# Patient Record
Sex: Male | Born: 1935 | Race: White | Hispanic: No | Marital: Single | Smoking: Current every day smoker
Health system: Southern US, Community
[De-identification: ages and names within clinical notes are randomized; demographics above are authoritative.]

## PROBLEM LIST (undated history)

## (undated) DIAGNOSIS — Z720 Tobacco use: Secondary | ICD-10-CM

## (undated) DIAGNOSIS — I10 Essential (primary) hypertension: Secondary | ICD-10-CM

## (undated) DIAGNOSIS — E785 Hyperlipidemia, unspecified: Secondary | ICD-10-CM

## (undated) DIAGNOSIS — N529 Male erectile dysfunction, unspecified: Secondary | ICD-10-CM

## (undated) DIAGNOSIS — I442 Atrioventricular block, complete: Secondary | ICD-10-CM

## (undated) DIAGNOSIS — J45909 Unspecified asthma, uncomplicated: Secondary | ICD-10-CM

## (undated) DIAGNOSIS — I2510 Atherosclerotic heart disease of native coronary artery without angina pectoris: Secondary | ICD-10-CM

## (undated) DIAGNOSIS — I739 Peripheral vascular disease, unspecified: Secondary | ICD-10-CM

## (undated) HISTORY — DX: Tobacco use: Z72.0

## (undated) HISTORY — DX: Essential (primary) hypertension: I10

## (undated) HISTORY — DX: Male erectile dysfunction, unspecified: N52.9

## (undated) HISTORY — DX: Peripheral vascular disease, unspecified: I73.9

## (undated) HISTORY — DX: Atrioventricular block, complete: I44.2

## (undated) HISTORY — DX: Atherosclerotic heart disease of native coronary artery without angina pectoris: I25.10

## (undated) HISTORY — DX: Hyperlipidemia, unspecified: E78.5

## (undated) HISTORY — PX: OTHER SURGICAL HISTORY: SHX169

## (undated) HISTORY — DX: Unspecified asthma, uncomplicated: J45.909

## (undated) HISTORY — PX: TONSILLECTOMY: SUR1361

---

## 2001-10-18 ENCOUNTER — Inpatient Hospital Stay (HOSPITAL_COMMUNITY): Admission: RE | Admit: 2001-10-18 | Discharge: 2001-10-18 | Payer: Self-pay | Admitting: Neurosurgery

## 2001-10-18 ENCOUNTER — Encounter: Payer: Self-pay | Admitting: Neurosurgery

## 2002-02-08 ENCOUNTER — Encounter: Payer: Self-pay | Admitting: Neurosurgery

## 2002-02-08 ENCOUNTER — Ambulatory Visit (HOSPITAL_COMMUNITY): Admission: RE | Admit: 2002-02-08 | Discharge: 2002-02-08 | Payer: Self-pay | Admitting: Neurosurgery

## 2003-02-04 ENCOUNTER — Ambulatory Visit (HOSPITAL_COMMUNITY): Admission: RE | Admit: 2003-02-04 | Discharge: 2003-02-04 | Payer: Self-pay | Admitting: Neurosurgery

## 2003-03-06 ENCOUNTER — Encounter: Admission: RE | Admit: 2003-03-06 | Discharge: 2003-03-06 | Payer: Self-pay | Admitting: Family Medicine

## 2003-04-19 ENCOUNTER — Emergency Department (HOSPITAL_COMMUNITY): Admission: EM | Admit: 2003-04-19 | Discharge: 2003-04-19 | Payer: Self-pay | Admitting: *Deleted

## 2004-06-17 ENCOUNTER — Ambulatory Visit: Payer: Self-pay | Admitting: Family Medicine

## 2005-01-05 ENCOUNTER — Ambulatory Visit: Payer: Self-pay | Admitting: Family Medicine

## 2005-05-22 ENCOUNTER — Encounter: Admission: RE | Admit: 2005-05-22 | Discharge: 2005-05-22 | Payer: Self-pay | Admitting: Ophthalmology

## 2005-05-26 ENCOUNTER — Ambulatory Visit: Payer: Self-pay | Admitting: Internal Medicine

## 2005-05-31 ENCOUNTER — Encounter: Admission: RE | Admit: 2005-05-31 | Discharge: 2005-05-31 | Payer: Self-pay | Admitting: Neurology

## 2005-09-13 ENCOUNTER — Ambulatory Visit: Payer: Self-pay | Admitting: Family Medicine

## 2006-06-17 ENCOUNTER — Emergency Department (HOSPITAL_COMMUNITY): Admission: EM | Admit: 2006-06-17 | Discharge: 2006-06-17 | Payer: Self-pay | Admitting: Emergency Medicine

## 2006-09-14 ENCOUNTER — Ambulatory Visit: Payer: Self-pay | Admitting: Family Medicine

## 2006-10-12 DIAGNOSIS — I10 Essential (primary) hypertension: Secondary | ICD-10-CM | POA: Insufficient documentation

## 2006-10-12 DIAGNOSIS — Z8679 Personal history of other diseases of the circulatory system: Secondary | ICD-10-CM | POA: Insufficient documentation

## 2006-11-08 ENCOUNTER — Ambulatory Visit: Payer: Self-pay | Admitting: Family Medicine

## 2006-11-08 LAB — CONVERTED CEMR LAB
BUN: 11 mg/dL (ref 6–23)
Basophils Relative: 0.9 % (ref 0.0–1.0)
Bilirubin Urine: NEGATIVE
Bilirubin, Direct: 0.2 mg/dL (ref 0.0–0.3)
Creatinine, Ser: 1.3 mg/dL (ref 0.4–1.5)
Direct LDL: 171.8 mg/dL
Eosinophils Absolute: 0.2 10*3/uL (ref 0.0–0.6)
GFR calc non Af Amer: 57 mL/min
MCHC: 34.3 g/dL (ref 30.0–36.0)
Monocytes Absolute: 0.7 10*3/uL (ref 0.2–0.7)
Monocytes Relative: 7.8 % (ref 3.0–11.0)
Neutro Abs: 4.9 10*3/uL (ref 1.4–7.7)
Neutrophils Relative %: 52.5 % (ref 43.0–77.0)
Nitrite: NEGATIVE
PSA: 1.24 ng/mL (ref 0.10–4.00)
Platelets: 271 10*3/uL (ref 150–400)
Protein, U semiquant: NEGATIVE
RBC: 4.89 M/uL (ref 4.22–5.81)
RDW: 13.1 % (ref 11.5–14.6)
Specific Gravity, Urine: 1.015
TSH: 4.21 microintl units/mL (ref 0.35–5.50)
Total CHOL/HDL Ratio: 7.5
Total Protein: 6.6 g/dL (ref 6.0–8.3)
Triglycerides: 114 mg/dL (ref 0–149)
VLDL: 23 mg/dL (ref 0–40)
WBC: 9.3 10*3/uL (ref 4.5–10.5)
pH: 5

## 2006-11-15 ENCOUNTER — Ambulatory Visit: Payer: Self-pay | Admitting: Family Medicine

## 2006-11-15 DIAGNOSIS — I2510 Atherosclerotic heart disease of native coronary artery without angina pectoris: Secondary | ICD-10-CM | POA: Insufficient documentation

## 2006-11-15 DIAGNOSIS — F17200 Nicotine dependence, unspecified, uncomplicated: Secondary | ICD-10-CM

## 2006-11-29 ENCOUNTER — Ambulatory Visit: Payer: Self-pay | Admitting: Family Medicine

## 2007-08-06 ENCOUNTER — Ambulatory Visit: Payer: Self-pay | Admitting: Family Medicine

## 2007-08-06 DIAGNOSIS — H6120 Impacted cerumen, unspecified ear: Secondary | ICD-10-CM

## 2007-11-11 ENCOUNTER — Ambulatory Visit: Payer: Self-pay | Admitting: Family Medicine

## 2007-11-11 DIAGNOSIS — I498 Other specified cardiac arrhythmias: Secondary | ICD-10-CM

## 2007-11-13 ENCOUNTER — Telehealth: Payer: Self-pay | Admitting: Family Medicine

## 2007-11-14 ENCOUNTER — Ambulatory Visit: Payer: Self-pay | Admitting: Family Medicine

## 2007-11-18 ENCOUNTER — Telehealth: Payer: Self-pay | Admitting: Cardiology

## 2007-11-19 ENCOUNTER — Ambulatory Visit: Payer: Self-pay | Admitting: Cardiology

## 2007-11-21 ENCOUNTER — Encounter: Payer: Self-pay | Admitting: Cardiology

## 2007-11-21 ENCOUNTER — Ambulatory Visit: Payer: Self-pay

## 2007-11-21 ENCOUNTER — Telehealth: Payer: Self-pay | Admitting: Family Medicine

## 2007-11-25 ENCOUNTER — Ambulatory Visit: Payer: Self-pay | Admitting: Cardiology

## 2007-11-28 ENCOUNTER — Ambulatory Visit: Payer: Self-pay | Admitting: Cardiology

## 2007-11-28 LAB — CONVERTED CEMR LAB
Basophils Relative: 0.9 % (ref 0.0–3.0)
Eosinophils Relative: 1.8 % (ref 0.0–5.0)
Glucose, Bld: 99 mg/dL (ref 70–99)
HCT: 49 % (ref 39.0–52.0)
Hemoglobin: 16.9 g/dL (ref 13.0–17.0)
INR: 1 (ref 0.8–1.0)
Lymphocytes Relative: 32.5 % (ref 12.0–46.0)
MCHC: 34.5 g/dL (ref 30.0–36.0)
Neutro Abs: 5.4 10*3/uL (ref 1.4–7.7)
Platelets: 233 10*3/uL (ref 150–400)
Potassium: 4.7 meq/L (ref 3.5–5.1)
Prothrombin Time: 12.2 s (ref 10.9–13.3)
RBC: 5.21 M/uL (ref 4.22–5.81)
Sodium: 142 meq/L (ref 135–145)
WBC: 9.8 10*3/uL (ref 4.5–10.5)
aPTT: 32.1 s — ABNORMAL HIGH (ref 21.7–29.8)

## 2007-11-29 ENCOUNTER — Inpatient Hospital Stay (HOSPITAL_BASED_OUTPATIENT_CLINIC_OR_DEPARTMENT_OTHER): Admission: RE | Admit: 2007-11-29 | Discharge: 2007-11-29 | Payer: Self-pay | Admitting: Internal Medicine

## 2007-11-29 ENCOUNTER — Ambulatory Visit: Payer: Self-pay | Admitting: Internal Medicine

## 2007-12-04 ENCOUNTER — Ambulatory Visit: Payer: Self-pay | Admitting: Internal Medicine

## 2007-12-05 DIAGNOSIS — I442 Atrioventricular block, complete: Secondary | ICD-10-CM

## 2007-12-05 HISTORY — PX: PACEMAKER INSERTION: SHX728

## 2007-12-05 HISTORY — DX: Atrioventricular block, complete: I44.2

## 2007-12-06 ENCOUNTER — Ambulatory Visit (HOSPITAL_COMMUNITY): Admission: RE | Admit: 2007-12-06 | Discharge: 2007-12-07 | Payer: Self-pay | Admitting: Internal Medicine

## 2007-12-06 ENCOUNTER — Ambulatory Visit: Payer: Self-pay | Admitting: Internal Medicine

## 2007-12-16 ENCOUNTER — Ambulatory Visit: Payer: Self-pay | Admitting: Internal Medicine

## 2008-03-05 ENCOUNTER — Ambulatory Visit: Payer: Self-pay | Admitting: Internal Medicine

## 2008-04-13 DIAGNOSIS — I442 Atrioventricular block, complete: Secondary | ICD-10-CM

## 2008-10-30 ENCOUNTER — Encounter (INDEPENDENT_AMBULATORY_CARE_PROVIDER_SITE_OTHER): Payer: Self-pay | Admitting: *Deleted

## 2008-11-27 ENCOUNTER — Ambulatory Visit: Payer: Self-pay | Admitting: Internal Medicine

## 2008-11-27 ENCOUNTER — Encounter (INDEPENDENT_AMBULATORY_CARE_PROVIDER_SITE_OTHER): Payer: Self-pay | Admitting: *Deleted

## 2008-11-27 ENCOUNTER — Ambulatory Visit: Payer: Self-pay

## 2008-11-27 DIAGNOSIS — I359 Nonrheumatic aortic valve disorder, unspecified: Secondary | ICD-10-CM

## 2008-11-27 DIAGNOSIS — I714 Abdominal aortic aneurysm, without rupture, unspecified: Secondary | ICD-10-CM | POA: Insufficient documentation

## 2008-12-14 ENCOUNTER — Encounter: Payer: Self-pay | Admitting: Internal Medicine

## 2009-01-14 ENCOUNTER — Telehealth (INDEPENDENT_AMBULATORY_CARE_PROVIDER_SITE_OTHER): Payer: Self-pay | Admitting: *Deleted

## 2009-07-20 ENCOUNTER — Encounter: Payer: Self-pay | Admitting: Internal Medicine

## 2009-07-21 ENCOUNTER — Ambulatory Visit (HOSPITAL_COMMUNITY): Admission: RE | Admit: 2009-07-21 | Discharge: 2009-07-21 | Payer: Self-pay | Admitting: Internal Medicine

## 2009-07-21 ENCOUNTER — Encounter: Payer: Self-pay | Admitting: Internal Medicine

## 2009-07-21 ENCOUNTER — Ambulatory Visit: Payer: Self-pay

## 2009-07-21 ENCOUNTER — Ambulatory Visit: Payer: Self-pay | Admitting: Internal Medicine

## 2009-08-16 ENCOUNTER — Ambulatory Visit: Payer: Self-pay

## 2009-08-16 ENCOUNTER — Ambulatory Visit: Payer: Self-pay | Admitting: Internal Medicine

## 2009-08-17 ENCOUNTER — Encounter: Payer: Self-pay | Admitting: Internal Medicine

## 2009-08-17 ENCOUNTER — Telehealth: Payer: Self-pay | Admitting: Internal Medicine

## 2009-08-24 ENCOUNTER — Telehealth: Payer: Self-pay | Admitting: Internal Medicine

## 2009-09-01 ENCOUNTER — Ambulatory Visit: Payer: Self-pay | Admitting: Internal Medicine

## 2009-09-01 DIAGNOSIS — I739 Peripheral vascular disease, unspecified: Secondary | ICD-10-CM

## 2009-09-02 ENCOUNTER — Telehealth (INDEPENDENT_AMBULATORY_CARE_PROVIDER_SITE_OTHER): Payer: Self-pay | Admitting: *Deleted

## 2009-09-09 ENCOUNTER — Encounter: Payer: Self-pay | Admitting: Internal Medicine

## 2009-10-01 ENCOUNTER — Ambulatory Visit: Payer: Self-pay | Admitting: Cardiovascular Disease

## 2010-03-27 LAB — CONVERTED CEMR LAB
ALT: 16 units/L (ref 0–53)
AST: 21 units/L (ref 0–37)
Albumin: 4 g/dL (ref 3.5–5.2)
Alkaline Phosphatase: 55 units/L (ref 39–117)
BUN: 13 mg/dL (ref 6–23)
Cholesterol: 142 mg/dL (ref 0–200)
Cholesterol: 204 mg/dL — ABNORMAL HIGH (ref 0–200)
LDL Cholesterol: 88 mg/dL (ref 0–99)
Sodium: 142 meq/L (ref 135–145)
Total Bilirubin: 0.7 mg/dL (ref 0.3–1.2)
Total CHOL/HDL Ratio: 5

## 2010-03-29 ENCOUNTER — Encounter (INDEPENDENT_AMBULATORY_CARE_PROVIDER_SITE_OTHER): Payer: Self-pay | Admitting: *Deleted

## 2010-03-29 NOTE — Cardiovascular Report (Signed)
Summary: Office Visit   Office Visit   Imported By: Roderic Ovens 09/02/2009 11:09:44  _____________________________________________________________________  External Attachment:    Type:   Image     Comment:   External Document

## 2010-03-29 NOTE — Assessment & Plan Note (Signed)
Summary: rov/ gd   Visit Type:  Follow-up Primary Provider:  Dr Marjory Lies   History of Present Illness: The patient presents today for routine electrophysiology followup. He reports doing very well since last being seen in our clinic. The patient denies symptoms of palpitations, chest pain, shortness of breath, orthopnea, PND, lower extremity edema, dizziness, presyncope, syncope, or neurologic sequela. The patient is tolerating medications without difficulties and is otherwise without complaint today.   Current Medications (verified): 1)  Adult Aspirin Low Strength 81 Mg  Tbdp (Aspirin) .... Take One Tablet By Mouth Once Daily. 2)  Norvasc 10 Mg Tabs (Amlodipine Besylate) .... Take One Tablet Daily  Allergies: 1)  ! Penicillin 2)  ! * Mycins  Past History:  Past Medical History: Hypertension Transient ischemic attack, hx of ED Coronary artery disease tobacco abuse Peripheral vascular disease  Hyperlipidemia.  Second degree AV block s/p PPM 12/05/07, now complete heart  block moderate AS  Past Surgical History: Reviewed history from 04/13/2008 and no changes required. Lumbar HNP Tonsillectomy Pacemaker implantation 12/05/07  Social History: Reviewed history from 04/13/2008 and no changes required. The patient is divorced and lives in [place].  He has smoked one- pack per day for more than 60 years.  He drinks two scotch drinks per day.  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   Review of Systems       All systems are reviewed and negative except as listed in the HPI.   Vital Signs:  Patient profile:   77 year old male Height:      66 inches Weight:      167 pounds BMI:     27.05 Pulse rate:   72 / minute BP sitting:   118 / 70  (left arm)  Vitals Entered By: Laurance Flatten CMA (Jul 21, 2009 10:37 AM)  Physical Exam  General:  Well developed, well nourished, in no acute distress. Head:  normocephalic and atraumatic Eyes:  PERRLA/EOM intact;  conjunctiva and lids normal. Mouth:  Teeth, gums and palate normal. Oral mucosa normal. Neck:  No deformities, masses, or tenderness noted. Lungs:  Clear bilaterally to auscultation and percussion. Heart:  RRR, 3/6 SEM LUSB (mid peaking) Abdomen:  Bowel sounds positive; abdomen soft and non-tender without masses, organomegaly, or hernias noted. No hepatosplenomegaly. Msk:  Back normal, normal gait. Muscle strength and tone normal. Pulses:  pulses normal in all 4 extremities Extremities:  No clubbing or cyanosis. Neurologic:  Alert and oriented x 3. Skin:  Intact without lesions or rashes. Cervical Nodes:  no significant adenopathy Psych:  Normal affect.   PPM Specifications Following MD:  Hillis Range, MD     PPM Vendor:  [employer] Jude     PPM Model Number:  [id number]     PPM Serial Number:  [id number] PPM DOI:  12/06/2007     PPM Implanting MD:  Lewayne Bunting, MD  Lead 1    Location: RA     DOI: 12/06/2007     Model #: [id number]     Serial #: [id number]     Status: active Lead 2    Location: RV     DOI: 12/06/2007     Model #: 1788TC     Serial #: [id number]     Status: active   Indications:  CHB   PPM Follow Up Battery Voltage:  2.78 V     Battery Est. Longevity:  >33yrs     Pacer Dependent:  Yes  PPM Device Measurements Atrium  Amplitude: 1.8 mV, Impedance: 401 ohms, Threshold: 0.50 V at 0.4 msec Right Ventricle  Amplitude: paced mV, Impedance: 472 ohms, Threshold: 0.75 V at 0.40 msec  Episodes MS Episodes:  166     Percent Mode Switch:  <1%     Coumadin:  No Ventricular High Rate:  0     Atrial Pacing:  5.0%     Ventricular Pacing:  99%  Parameters Mode:  DDD     Lower Rate Limit:  60     Upper Rate Limit:  120 Paced AV Delay:  180     Sensed AV Delay:  150 Tech Comments:  NORMAL DEVICE FUNCTION.  RV AMPLITUDE CHANGED FROM 0.875 TO 1.00 V.  TURNED AUTO MODE SWITCH FROM DDI TO DDIR.  LONGEST MODE SWITCH WAS 6 MINUTES 48 SECONDS.  SOME HIGH VENTRICULAR THRESHOLDS ON TREND BUT AUTOCAPTURE  RECOMMENDED TODAY W/NORMAL THRESHOLD. WILL DISCUSS W/DR Johney Frame NEED TO TURN ON EGMs TO EVALUATE MODE SWITCH EPISODES.  kristin bankston MD Comments:  agree  Impression & Recommendations:  Problem # 1:  SECOND DEGREE AV BLOCK, MOBITZ II (ICD-426.12) Now complete HB normal pacemaker function  Problem # 2:  AORTIC STENOSIS (ICD-424.1) no symptoms moderate by exam echo this am pending  Problem # 3:  TOBACCO ABUSE (ICD-305.1) cessation advised, pt not willing to quit  Problem # 4:  HYPERTENSION (ICD-401.9)  stable The following medications were removed from the medication list:    Amlodipine Besylate 5 Mg Tabs (Amlodipine besylate) .Marland Kitchen... Take one tablet once daily His updated medication list for this problem includes:    Adult Aspirin Low Strength 81 Mg Tbdp (Aspirin) .Marland Kitchen... Take one tablet by mouth once daily.    Norvasc 10 Mg Tabs (Amlodipine besylate) .Marland Kitchen... Take one tablet daily  The following medications were removed from the medication list:    Amlodipine Besylate 5 Mg Tabs (Amlodipine besylate) .Marland Kitchen... Take one tablet once daily His updated medication list for this problem includes:    Adult Aspirin Low Strength 81 Mg Tbdp (Aspirin) .Marland Kitchen... Take one tablet by mouth once daily.    Norvasc 10 Mg Tabs (Amlodipine besylate) .Marland Kitchen... Take one tablet daily  Patient Instructions: 1)  Your physician recommends that you schedule a follow-up appointment in: 6 months with device clinic 2)  Your physician recommends that you return for lab work in: 08/16/09 at the same time as your doppler appoinment.  Don't eat or drink after midnight the night before you come  Appended Document: rov/ gd Echo reveals EF 40-45% with moderate AS. Repeat Echo in 6 months.

## 2010-03-29 NOTE — Assessment & Plan Note (Signed)
Summary: rov/f/u from echo & Aorta  Medications Added SIMVASTATIN 20 MG TABS (SIMVASTATIN) Take one tablet by mouth daily at bedtime * CINNAMON once daily * BROMELAIN once daily GINKGO 60 MG TABS (GINKGO BILOBA) once daily RED YEAST RICE   POWD (RED YEAST RICE EXTRACT) once daily        Visit Type:  Follow-up Primary Provider:  Dr Marjory Lies   History of Present Illness: The patient presents today for routine electrophysiology followup. He reports doing very well since last being seen in our clinic.  He reports weakness in his legs and leg fatigue with moderate activity.  He reports occasional "tingling" in his toes.  He denies rest pain.  The patient denies symptoms of palpitations, chest pain, shortness of breath, orthopnea, PND, lower extremity edema, dizziness, presyncope, syncope, or neurologic sequela. The patient is tolerating medications without difficulties and is otherwise without complaint today.   Current Medications (verified): 1)  Adult Aspirin Low Strength 81 Mg  Tbdp (Aspirin) .... Take One Tablet By Mouth Once Daily. 2)  Norvasc 10 Mg Tabs (Amlodipine Besylate) .... Take One Tablet Daily 3)  Simvastatin 20 Mg Tabs (Simvastatin) .... Take One Tablet By Mouth Daily At Bedtime 4)  Cinnamon .... Once Daily 5)  Bromelain .... Once Daily 6)  Ginkgo 60 Mg Tabs (Ginkgo Biloba) .... Once Daily 7)  Red Yeast Rice   Powd (Red Yeast Rice Extract) .... Once Daily  Allergies: 1)  ! Penicillin 2)  ! * Mycins  Past History:  Past Medical History: Hypertension Transient ischemic attack, hx of ED Coronary artery disease tobacco abuse Peripheral vascular disease - infrarenal AAA (2.9x3.3cm),  severe PAD involving both iliac vessels Hyperlipidemia.  Second degree AV block s/p PPM 12/05/07, now complete heart  block moderate AS  Past Surgical History: Reviewed history from 04/13/2008 and no changes required. Lumbar HNP Tonsillectomy Pacemaker implantation  12/05/07  Social History: Reviewed history from 04/13/2008 and no changes required. The patient is divorced and lives in [place].  He has smoked one- pack per day for more than 60 years.  He drinks two scotch drinks per day.  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   Review of Systems       All systems are reviewed and negative except as listed in the HPI.   Vital Signs:  Patient profile:   77 year old male Height:      66 inches Weight:      166 pounds BMI:     26.89 Pulse rate:   72 / minute BP sitting:   128 / 70  (left arm)  Vitals Entered By: Laurance Flatten CMA (September 01, 2009 9:07 AM)  Physical Exam  General:  Well developed, well nourished, in no acute distress. Head:  normocephalic and atraumatic Eyes:  PERRLA/EOM intact; conjunctiva and lids normal. Mouth:  Teeth, gums and palate normal. Oral mucosa normal. Neck:  No deformities, masses, or tenderness noted. Chest Wall:  pacemaker site is well healed Lungs:  Clear bilaterally to auscultation and percussion. Heart:  RRR, 3/6 SEM LUSB (mid peaking) Abdomen:  Bowel sounds positive; abdomen soft and non-tender without masses, organomegaly, or hernias noted. No hepatosplenomegaly. Msk:  Back normal, normal gait. Muscle strength and tone normal. Pulses:  diminished pulses DP/PT bilateral,  good cap refill Extremities:  No clubbing or cyanosis. Neurologic:  Alert and oriented x 3. Skin:  Intact without lesions or rashes. Psych:  Normal affect.   PPM Specifications Following MD:  Hillis Range, MD     PPM Vendor:  [employer] Jude     PPM Model Number:  [id number]     PPM Serial Number:  [id number] PPM DOI:  12/06/2007     PPM Implanting MD:  Lewayne Bunting, MD  Lead 1    Location: RA     DOI: 12/06/2007     Model #: 1699TC     Serial #: [id number]     Status: active Lead 2    Location: RV     DOI: 12/06/2007     Model #: 1788TC     Serial #: [id number]     Status: active  Magnet Response Rate:  BOL 98.6 ERI 86.3  Indications:   CHB   PPM Follow Up Remote Check?  No Battery Voltage:  2.78 V     Battery Est. Longevity:  10 YEARS     Pacer Dependent:  Yes       PPM Device Measurements Atrium  Amplitude: 2.0 mV, Impedance: 379 ohms, Threshold: 0.5 V at 0.4 msec Right Ventricle  Impedance: 453 ohms, Threshold: 0.75 V at 0.4 msec  Episodes MS Episodes:  18     Percent Mode Switch:  <1%     Coumadin:  No Atrial Pacing:  6.6%     Ventricular Pacing:  100%  Parameters Mode:  DDD     Lower Rate Limit:  60     Upper Rate Limit:  120 Paced AV Delay:  180     Sensed AV Delay:  150 Next Cardiology Appt Due:  02/27/2010 Tech Comments:  No parameter changes.  Device function normal.  18 mode switch episodes the longest 5:44 minutes, - coumadin, ventricular rates controlled.   ROV 6 months clinic. Altha Harm, LPN  September 02, 1599 9:24 AM  MD Comments:  agree  Impression & Recommendations:  Problem # 1:  SECOND DEGREE AV BLOCK, MOBITZ II (ICD-426.12) now complete AV block normal pacemaker function  Problem # 2:  TOBACCO ABUSE (ICD-305.1) cessation advised,  pt very clear in his decision to continue smoking  Problem # 3:  PVD (ICD-443.9) recent US results reviewed with patient stable exam tobacco cessation and daily walking encouraged will schedule pt to see Dr Excell Seltzer for evaluation and possible PV study  Other Orders: Misc. Referral (Misc. Ref)  Patient Instructions: 1)  Your physician recommends that you schedule a follow-up appointment in: 6 months with Dr. Oneda Duffett./ Follow up with Dr. Excell Seltzer , next available appointment. 2)  Your physician recommends that you continue on your current medications as directed. Please refer to the Current Medication list given to you today. 3)  Your physician recommends that you Stop smoking. 4)  Your physician recommends that you  walk per day.

## 2010-03-29 NOTE — Miscellaneous (Signed)
Summary: [employer] Designated Party Release   Imported By: Roderic Ovens 09/17/2009 11:57:19  _____________________________________________________________________  External Attachment:    Type:   Image     Comment:   External Document

## 2010-03-29 NOTE — Progress Notes (Signed)
Summary: returning call   Phone Note Call from Patient Call back at Work Phone [phone]   Caller: Daughter Reason for Call: Talk to Nurse Summary of Call: returning call Initial call taken by: Migdalia Dk,  August 17, 2009 9:37 AM  Follow-up for Phone Call        called daughter back and lmom with the results of labs.  Also let her know we had mailed a copy to her father.   Dennis Bast, RN, BSN  August 17, 2009 10:07 AM

## 2010-03-29 NOTE — Letter (Signed)
Summary: Custom - Lipid  [facility], Main Office  [address]   [place], [place] [address]   Phone: [phone]  Fax: [phone]     August 17, 2009 MRN: [id number]   John Farmer [address] [place], [place]  [address]   Dear Mr. DECARLO,  We have reviewed your cholesterol results.  They are as follows:     Total Cholesterol:    204 (Desirable: less than 200)       HDL  Cholesterol:     39.00  (Desirable: greater than 40 for men and 50 for women)       LDL Cholesterol:       88  (Desirable: less than 100 for low risk and less than 70 for moderate to high risk)       Triglycerides:       84.0  (Desirable: less than 150)  Our recommendations include:   Call our office at the number listed above if you have any questions.  Lowering your LDL cholesterol is important, but it is only one of a large number of "risk factors" that may indicate that you are at risk for heart disease, stroke or other complications of hardening of the arteries.  Other risk factors include:   A.  Cigarette Smoking* B.  High Blood Pressure* C.  Obesity* D.   Low HDL Cholesterol (see yours above)* E.   Diabetes Mellitus (higher risk if your is uncontrolled) F.  Family history of premature heart disease G.  Previous history of stroke or cardiovascular disease    *These are risk factors YOU HAVE CONTROL OVER.  For more information, visit .  There is now evidence that lowering the TOTAL CHOLESTEROL AND LDL CHOLESTEROL can reduce the risk of heart disease.  The American Heart Association recommends the following guidelines for the treatment of elevated cholesterol:  1.  If there is now current heart disease and less than two risk factors, TOTAL CHOLESTEROL should be less than 200 and LDL CHOLESTEROL should be less than 100. 2.  If there is current heart disease or two or more risk factors, TOTAL CHOLESTEROL should be less than 200 and LDL CHOLESTEROL should be less than 70.  A  diet low in cholesterol, saturated fat, and calories is the cornerstone of treatment for elevated cholesterol. .  Studies have shown that 30 to 60 minutes of physical activity most days can help lower blood pressure, lower cholesterol, and keep your weight at a healthy level.  Drug therapy is used when cholesterol levels do not respond to therapeutic lifestyle changes (smoking cessation, diet, and exercise) and remains unacceptably high.  If medication is started, it is important to have you levels checked periodically to evaluate the need for further treatment options.   Dr Johney Frame wants you to start Simvastin 20mg  for your cholesterol.  You take one tablet by mouth every night before bed.  I tried to call you but the number we have here at the office is out of service.  Call me at [phone] and we can go over your labs.  Thank you,   Anselm Pancoast

## 2010-03-29 NOTE — Miscellaneous (Signed)
Summary: Orders Update  Clinical Lists Changes  Orders: Added new Test order of Abdominal Aorta Duplex (Abd Aorta Duplex) - Signed 

## 2010-03-29 NOTE — Progress Notes (Signed)
Summary: SPEAK TO NURSE   Phone Note Call from Patient Call back at Work Phone [phone]   Caller: Daughter/[name] Reason for Call: Talk to Nurse Summary of Call: REQUEST TO SPEAK TO NURSE ABOUT UPCOMING APPT Initial call taken by: Migdalia Dk,  August 24, 2009 3:20 PM  Follow-up for Phone Call        will call daughter after appoinment  she says he has been c/o his legs giving out when walking.  please call her after the appt.  Dennis Bast, RN, BSN  August 24, 2009 3:59 PM

## 2010-03-29 NOTE — Cardiovascular Report (Signed)
Summary: [facility]   [facility]   Imported By: Roderic Ovens 07/30/2009 15:59:25  _____________________________________________________________________  External Attachment:    Type:   Image     Comment:   External Document

## 2010-03-29 NOTE — Progress Notes (Signed)
   Walk in Patient Form Recieved " Pt's Daughter Brought in FLMA papers to care for Her Father" I have sent to [facility]  September 02, 2009 8:30 AM     Appended Document:  FMLA papers are for Daughter to care for Father  Appended Document:  pt came in today w/ FMLA packet that was mailed to him,he completed i sent to [facility].

## 2010-03-29 NOTE — Assessment & Plan Note (Signed)
Summary: NEW PV      Allergies Added:   Visit Type:  Initial Consult Primary Provider:  Dr Marjory Lies  CC:  new pv patient. Pt states he has no concerns however daughter states that when he bends over his legs are not holding him up.  History of Present Illness: 77 year-old male referred for evaluation of lower extremity PAD. He complains of bilateral leg weakness with prolonged walking or bending over. He denies leg pain, tightness, or cramping. Specifically denies calf pain with walking. No rest pain or ischemic ulceration. No history of stroke or TIA.   He underwent an abdominal duplex scan to follow-up findings from cardiac cath. Cardiac cath demonstrated a small AAA and also showed iliac stenosis. The duplex confirmed a small infrarenal AAA 2.9x3.2 cm and elevated common iliac velocities consistent with severe bilateral iliac stenosis.     Current Medications (verified): 1)  Adult Aspirin Low Strength 81 Mg  Tbdp (Aspirin) .... Take One Tablet By Mouth Once Daily. 2)  Norvasc 10 Mg Tabs (Amlodipine Besylate) .... Take One Tablet Daily 3)  Simvastatin 20 Mg Tabs (Simvastatin) .... Take One Tablet By Mouth Daily At Bedtime 4)  Cinnamon .... Once Daily 5)  Bromelain .... Once Daily 6)  Ginkgo 60 Mg Tabs (Ginkgo Biloba) .... Once Daily 7)  Red Yeast Rice   Powd (Red Yeast Rice Extract) .... Once Daily  Allergies (verified): 1)  ! Penicillin 2)  ! * Mycins  Past History:  Past medical, surgical, family and social histories (including risk factors) reviewed, and no changes noted (except as noted below).  Past Medical History: Hypertension ED Coronary artery disease tobacco abuse Peripheral vascular disease - infrarenal AAA (2.9x3.3cm),  severe PAD involving both iliac vessels Hyperlipidemia.  Second degree AV block s/p PPM 12/05/07, now complete heart  block moderate AS  Past Surgical History: Reviewed history from 04/13/2008 and no changes required. Lumbar  HNP Tonsillectomy Pacemaker implantation 12/05/07  Family History: Reviewed history from 09/24/2009 and no changes required. Family History of CAD Male 1st degree relative <60 Family History of Skin cancer Family History of Stroke F 1st degree relative <60 Family History of Stroke M 1st degree relative <50 Family History Thyroid disease  Social History: Reviewed history from 04/13/2008 and no changes required. The patient is divorced and lives in [place].  He has smoked one- pack per day for more than 60 years.  He drinks two scotch drinks per day.  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   Review of Systems       Negative except as per HPI    Vital Signs:  Patient profile:   77 year old male Height:      66 inches Weight:      169 pounds BMI:     27.38 Pulse rate:   74 / minute Pulse rhythm:   regular Resp:     16 per minute BP sitting:   136 / 72  (left arm) Cuff size:   regular  Vitals Entered By: Judithe Modest CMA (October 01, 2009 4:08 PM)  Physical Exam  General:  Pt is alert and oriented, elderly male, in no acute distress. HEENT: normal Neck: normal carotid upstrokes with bilateral bruits left greater than right, JVP normal Lungs: CTA CV: RRR with 3/6 harsh systolic murmur at LSB Abd: soft, NT, positive BS,  positive abdominal bruit, no organomegaly Ext: no clubbing, cyanosis, or edema. femoral pulses 2+ with bilateral bruits, DP 2+,  PT diminished bilaterally Skin: warm and dry without rash    PPM Specifications Following MD:  Hillis Range, MD     PPM Vendor:  [employer] Jude     PPM Model Number:  [id number]     PPM Serial Number:  [id number] PPM DOI:  12/06/2007     PPM Implanting MD:  Lewayne Bunting, MD  Lead 1    Location: RA     DOI: 12/06/2007     Model #: 1699TC     Serial #: [id number]     Status: active Lead 2    Location: RV     DOI: 12/06/2007     Model #: 1788TC     Serial #: [id number]     Status: active  Magnet Response Rate:  BOL 98.6 ERI  86.3  Indications:  CHB   PPM Follow Up Pacer Dependent:  Yes      Episodes Coumadin:  No  Parameters Mode:  DDD     Lower Rate Limit:  60     Upper Rate Limit:  120 Paced AV Delay:  180     Sensed AV Delay:  150  Impression & Recommendations:  Problem # 1:  PVD (ICD-443.9) The pt has ultrasound and angio evidence of bilateral iliac stenosis. His symptoms are atypical for claudication and I don't think he has symptomatic iliac disease. I think it would be appropriate to observe Mr Golphin at this point and consider angiography if he develops progressive symptoms. He is on appropriate medical therapy with ASA and a statin drug. I counseled him regarding cigarettes, but he has no interest in quitting. Will follow-up with him in 12 months with a repeat aortoiliac duplex and ABI's. Discussed this at length with the patient and his daughter who is a surgical nurse at [employer].  Patient Instructions: 1)  Your physician wants you to follow-up in: 1 YEAR.  You will receive a reminder letter in the mail two months in advance. If you don't receive a letter, please call our office to schedule the follow-up appointment. 2)  Your physician has requested that you have an abdominal aortailiac duplex in 1 YEAR. During this test, an ultrasound is used to evaluate the aorta. Allow 30 minutes for this exam. Do not eat after midnight the day before and avoid carbonated beverages. There are no restrictions or special instructions. 3)  Your physician has requested that you have an ankle brachial index (ABI) in 1 YEAR. During this test an ultrasound and blood pressure cuff are used to evaluate the arteries that supply the arms and legs with blood. Allow thirty minutes for this exam. There are no restrictions or special instructions.

## 2010-04-06 NOTE — Letter (Signed)
Summary: Appointment - Reminder 2  [facility], Main Office  [address]   [place], [place] [address]   Phone: [phone]  Fax: [phone]     March 29, 2010 MRN: [id number]   John Farmer [address] [place], [place]  [address]   Dear John Farmer,  Our records indicate that it is past time to schedule a follow-up appointment. Dr.Taylor recommended that you follow up with [place] in January. It is very important that we reach you to schedule this appointment. We look forward to participating in your health care needs. Please contact us at the number listed above at your earliest convenience to schedule your appointment.  If you are unable to make an appointment at this time, give [place] a call so we can update our records.     Sincerely,   Glass blower/designer

## 2010-05-24 ENCOUNTER — Encounter (INDEPENDENT_AMBULATORY_CARE_PROVIDER_SITE_OTHER): Payer: Self-pay | Admitting: *Deleted

## 2010-05-31 NOTE — Letter (Signed)
Summary: Appointment - Reminder 2  [facility], Main Office  [address]   [place], [place] [address]   Phone: [phone]  Fax: [phone]     May 24, 2010 MRN: [id number]   John Farmer [address] [place], [place]  [address]   Dear [name],  Our records indicate that it is past time to schedule a follow-up appointment.  Dr.Taylor recommended that you follow up with [place] in January. It is very important that we reach you to schedule this appointment. We look forward to participating in your health care needs. Please contact us at the number listed above at your earliest convenience to schedule your appointment.  If you are unable to make an appointment at this time, give [place] a call so we can update our records.     Sincerely,   Glass blower/designer

## 2010-07-12 NOTE — Assessment & Plan Note (Signed)
[facility]                            CARDIOLOGY OFFICE NOTE   HARVIR, PATRY                     MRN:          [id number]  DATE:11/19/2007                            DOB:          05-05-1930    REFERRING PHYSICIAN:  [name], MD   [name] is referred by Dr. Kelle Darting for bradycardia.   HISTORY OF PRESENT ILLNESS:  John Farmer is a 77 year old very  independent, separated white male who comes today because of  bradycardia.   His daughter is with him who works at [employer] at [employer].   John Farmer' family has noted blood pressures to be normal or high, but  pulses in the 30s to mid 40s.  He has had 1 episode of dizziness when he  was bending over in the garden pulling tomato plants recently.  He did  not lose consciousness.  He simply sat down and said he was tired.   He is clearly very stoic individual who refuses to complain.  He denies  any angina, any ischemic equivalence, and he has significant dyspnea on  exertion.   He saw Dr. Tawanna Farmer the other day in the office.  His EKG showed normal  sinus rhythm with a PR interval 200 milliseconds, a right bundle-branch  block.  He had some ST-segment changes in III and aVF.   His blood work was unremarkable with a potassium of 4.1.  He also had a  TSH that was normal.   PAST MEDICAL HISTORY:  He was evaluated here in the past and had a  stress nuclear study, which showed EF of 63% with mild inferior wall  ischemia at the mid and basal level.  This was on April 24, 2003.  At  that time, he saw Dr. Geralynn Rile and cardiac catheterization was  recommended.  He refused the catheterization.  He was maintained on  medical therapy including aggressive management of his risk factors.   He is intolerant of all PENICILLIN DRUGS.   He smokes 1 pack of cigarettes and has 60-pack years.  He drinks 2  Scotches a day.  He does not like water and drinks a lot of coffee.   He  has had a previous ruptured disk in 1988 and 2003 with back surgery.   CURRENT MEDICATIONS:  1. Enteric-coated aspirin 81 mg a day.  2. Catapres 0.2 mg q.a.m.  3. Zocor 20 mg at bedtime.  4. Glucosamine/chondroitin daily.  5. He takes Viagra 100 mg p.r.n.   FAMILY HISTORY:  Negative for premature coronary artery disease.   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  He  still drives quite a bit and works around the area.   REVIEW OF SYSTEMS:  Negative.  All systems were reviewed.   PHYSICAL EXAMINATION:  VITAL SIGNS:  His blood pressure is 140/78.  His  pulse was 40 and regular.  His heart rate in Dr. Nelida Meuse office at the  [facility] at the time of his EKG was 52.  A year before it was 63 with  no  significant changes in his ST segments except there may be a slight  improvement in III and aVF.  He weighs 168 pounds.  HEENT:  Normocephalic and atraumatic.  PERRLA.  Extraocular movements  intact.  Sclerae are slightly injected.  Facial symmetry is normal.  Dentition satisfactory.  He has had previous surgery of his nose for  basal cell carcinoma.  NECK:  Supple.  Carotids upstrokes were equal bilaterally with slightly  mild carotid shudder particularly on the left.  He has bi-carotid sounds  versus bruits.  There is a slightly delayed upstroke.  LUNGS:  Clear to auscultation and percussion except for decreased breath  sounds throughout.  HEART:  A poorly appreciated PMI.  He has a normal S1 and S2 with S2  splitting.  No diastolic murmurs heard.  He has a 3/6 aortic stenosis  murmur.  ABDOMEN:  Soft, good bowel sounds.  There is no midline or pulsatile  mass.  There is no hepatomegaly.  EXTREMITIES:  No cyanosis, clubbing, or edema.  Pulses are 2+/4+  dorsalis pedis, 2+/4+ posterior tibialis.  He has pretty good capillary  refill.  There is a few varicose veins.  No DVT.  NEURO:  Intact.  SKIN:  Unremarkable except for basal cell carcinoma.   ASSESSMENT:  1.  Asymptomatic bradycardia.  2. Right bundle-branch block.  3. Borderline first-degree atrioventricular block.  4. History of coronary artery disease with a positive Cardiolite,      refused catheterization.  5. Hypertension.  6. Aortic stenosis.  7. Rule out carotid artery disease and vertebral disease.  8. Tobacco use.   John Farmer clearly has ischemic heart disease.  He may have carotid  artery and vetebral disease.  He may have a moderate degree of aortic  stenosis that is potentially worse.   Catapres can slow heart rate and we will wean this off for the next 3  days.  I have placed him on amlodipine 5 mg a day.  Once his Catapres is  off, we will obtain a 24-hour Holter with him doing his usual  activities.  If he clearly has a heart rate in the 40s working in his  yard, he will be referred for pacemaker.  We will obtain a 2-D  echocardiogram to look at his aortic valve.  We will also do carotid  Dopplers.  We will not do an ischemic evaluation since he has refused  this in the past.  I will see him back next week with his daughter to  come up with a game plan.   He has refused not to drive per his family's request.  He wants to drive  locally.  At this point in time, there is no medical reason he should  not, as I explained to him and his daughter today.  I would not  recommend long-distance drives.     Thomas C. Daleen Squibb, MD, [facility]  Electronically Signed    TCW/MedQ  DD: 11/19/2007  DT: 11/20/2007  Job #: [id number]   cc:   [name], MD

## 2010-07-12 NOTE — Assessment & Plan Note (Signed)
[facility]                            CARDIOLOGY OFFICE NOTE   John Farmer, John Farmer                     MRN:          [id number]  DATE:11/28/2007                            DOB:          [birth year]    CHIEF COMPLAINT:  Dizziness.   HISTORY OF PRESENT ILLNESS:  [name] is a pleasant 77-year-  old white male who I saw initially on November 19, 2007, for  bradycardia and dizziness.  Please refer to that note.   I heard an aortic murmur and also was concerned about him having a first-  degree block as well as a right bundle-branch block in the setting of  bradycardia and dizziness.  His dizziness was somewhat related to  bending over in the garden working.  He did not lose consciousness.   In addition, I heard carotid bruits and was concerned about  cerebrovascular disease.  However, it did not sound like he was  neurological.   His Holter monitor showed a maximum heart rate of 60 beats per minute  and his minimum rate was 37 beats per minute at 12:54 a.m.  His average  rate was 45 beats per minute.  There was no sign of complete heart  block.  He did not report any symptoms in his diary.   His 2D echocardiogram showed normal left ventricular systolic function,  mild LVH, moderately calcified aortic valve, moderate aortic valve  stenosis with a mean gradient of 28 mmHg, a valve area of 1.21 cm.  He  had mild mitral regurgitation.  He had mild elevations in his pulmonary  pressures.   Carotid Doppler showed no significant carotid disease.   PAST MEDICAL HISTORY:  He was evaluated in the past and had a stress  nuclear study by Dr. Randa Evens, which showed inferior wall  ischemia, this was in February 2005.  He was advised by Dr. Samule Ohm to  have a cath, but he refused.   ALLERGIES:  He is intolerant of all PENICILLIN DRUGS.  He is also  allergic to [facility].   SOCIAL HISTORY:  He smokes a pack of cigarettes a day and has for 60  years.  He says he drinks 2 scotches a day.  He does not like water and  drinks a lot of coffee.  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   He has had a previous ruptured disk in 1988 and 2003.   CURRENT MEDICATIONS:  1. Enteric-coated aspirin 81 mg a day.  2. He had been on Catapres, so I discontinued this and placed him on      amlodipine at 5 mg a day.  The Holter was done on amlodipine.  3. He is also on glucosamine.  4. Zocor 20 mg nightly.   FAMILY HISTORY:  Negative for premature coronary artery disease.   REVIEW OF SYSTEMS:  All systems are negative other than the HPI.   PHYSICAL EXAMINATION:  VITAL SIGNS:  Today, showed a blood pressure of  180/82, his pulse is currently 56 and regular.  His weight  is 168.  HEENT:  Normal.  NECK:  Carotid upstrokes were equal bilaterally with carotid shudder  particularly on the left with bilateral carotid bruit sounds versus  referred sounds.  He has a slightly delayed upstroke.  LUNGS:  Clear to auscultation and percussion except for decreased breath  sounds throughout.  HEART:  A poorly appreciated PMI.  Normal S1 and S2.  He has a 3/6  aortic stenosis murmur.  No diastolic component was appreciated.  ABDOMEN:  Soft.  Good bowel sounds.  No midline pulsatile mass.  No  hepatomegaly.  EXTREMITIES:  No cyanosis, clubbing, or edema.  Pulses are intact.  He  has pretty good capillary refill.  There are few varicose veins.  No  DVT.  NEUROLOGIC:  Intact.  SKIN:  Unremarkable except for basal cell carcinoma.   ASSESSMENT:  1. Sinus bradycardia with rates as low as 37 beats per minute.  He has      had 1 episode of postural dizziness, otherwise not that      symptomatic.  2. First-degree arteriovenous block.  3. Right bundle-branch block.  4. History of coronary artery disease with a positive Cardiolite,      refusing cath in the past.  5. Hypertension.  6. Aortic stenosis at least moderate by 2D  echocardiogram, seems worse      by a physical exam.  7. Nonobstructive carotid disease.  8. Tobacco use.   I have had a long talk with Mr. Elgersma and his daughter.  I have  recommended a heart catheterization to evaluate his coronaries, his  aortic valve and then also the questionable need for a pacer.   After a long discussion, he agrees to proceed.  We are arranging this  for next week.  Indications, risks, and potential benefits have been  discussed.     Thomas C. Daleen Squibb, MD, [facility]  Electronically Signed    TCW/MedQ  DD: 11/28/2007  DT: 11/29/2007  Job #: [id number]   cc:   [name], MD

## 2010-07-12 NOTE — Discharge Summary (Signed)
John Farmer, John Farmer NO.:  [id number]   MEDICAL RECORD NO.:  [id number]          PATIENT TYPE:  OIB   LOCATION:  3737                         FACILITY:  [facility]   PHYSICIAN:  Madolyn Frieze. Jens Som, MD, FACCDATE OF BIRTH:  03-11-1930   DATE OF ADMISSION:  12/06/2007  DATE OF DISCHARGE:  12/07/2007                               DISCHARGE SUMMARY   PROCEDURES:  Implantation of a dual-chamber pacemaker.   PRIMARY FINAL DISCHARGE DIAGNOSIS:  Symptomatic 2:1 heart block.   SECONDARY DIAGNOSIS:  1. Coronary artery disease noted at catheterization on November 29, 2007, with left anterior descending coronary artery 40%, second      obtuse marginal artery 2 totaled, circumflex 70 and then occluded,      ejection fraction 55%, small-to-moderate-sized infrarenal abdominal      aortic aneurysm with moderate right iliac disease.  2. Peripheral vascular disease noted at catheterization.  3. Hypertension.  4. Hyperlipidemia.  5. Tobacco use.  6. Status post echocardiogram in September 2009, showing a moderately      calcified aortic valve with a mean transaortic valve gradient of 28      mmHg.  7. Allergy or intolerance to penicillin and mycin drugs.   TIME OF DISCHARGE:  33 minutes.   HOSPITAL COURSE:  [name] is a 77 year old male with a  history of dizziness and bradycardia.  He underwent heart  catheterization and medical therapy was recommended.  He was seen by EP  and pacemaker was recommended.  He was brought to the hospital for this  on December 06, 2007.   [name] had implantation of a dual lead [employer] pacemaker without  complication.  His postoperative chest x-ray showed satisfactory  positions of the left subclavian pacemaker leads without pneumothorax.  His site was without significant ecchymosis or hematoma.  He was seen by  Dr. Jens Som on December 07, 2007, and considered stable for discharge  with outpatient followup arranged.  Of note, his  Norvasc was increased  for better blood pressure control.   DISCHARGE INSTRUCTIONS:  His activity level is to include no driving for  7 days.  He is to keep his incision clean and dry.  He is to call our  office for any problems.  He is to increase mobility to his left arm  gradually based on instructions.  He is to follow up with the [facility]inic for wound check in approximately 10 days and with Dr. Johney Farmer, Dr.  Daleen Farmer, and Dr. Tawanna Farmer as needed.   DISCHARGE MEDICATIONS:  1. Aspirin 81 mg daily.  2. Norvasc 10 mg daily.  3. Zocor 20 mg daily.  4. Tylenol as needed for pain.      John Demark, PA-C      Madolyn Frieze. Jens Som, MD, [facility]  Electronically Signed    RB/MEDQ  D:  12/07/2007  T:  12/07/2007  Job:  [id number]   cc:   John Jefferson, MD  John Sans. Wall, MD, [facility]

## 2010-07-12 NOTE — Cardiovascular Report (Signed)
NAME:  John Farmer, John Farmer NO.:  [id number]   MEDICAL RECORD NO.:  [id number]          PATIENT TYPE:  OIB   LOCATION:  NA                           FACILITY:  [facility]   PHYSICIAN:  Bevelyn Buckles. Bensimhon, MDDATE OF BIRTH:  [birth year]   DATE OF PROCEDURE:  11/29/2007  DATE OF DISCHARGE:                            CARDIAC CATHETERIZATION   PATIENT IDENTIFICATION:  John Farmer is a 77 year old male with a  history of hypertension.  He has no documented coronary artery disease  and he was referred to Dr. Daleen Squibb for further evaluation of dizziness and  bradycardia.  He has previously had abnormal Myoview showing anterior  wall ischemia back in February 2005, but refused cath.  Dr. Daleen Squibb  examined him and noted him to be significantly bradycardic with heart  rates in the 30s and 40s.  There was also suggestion of aortic stenosis.  He was thus referred for diagnostic right and left heart  catheterization.   PROCEDURES PERFORMED:  1. Right heart catheterization.  2. Selective coronary angiography.  3. Left heart catheterization  4. Left ventriculogram.  5. Abdominal aortogram.   DESCRIPTION OF PROCEDURE:  The risks and benefits of the catheterization  were explained, consent was signed, and placed on the chart.  A 7-French  venous sheath was placed in the right femoral vein and standard right  heart catheterization was performed with Swan-Ganz catheter.  A 4-French  arterial sheath was placed in the right femoral artery.  We had  significant trouble advancing the wire into the abdominal aorta.  We  used a Wholey wire and we were able to navigate the tortuous aorta.  A  3DRC and JR4 were used for coronary angiography.  Given the aortic  stenosis in the tortuous aorta took [place] few minutes to cross the aortic  valve.  We were able to get the pigtail catheter into the base of the  ventricle and were unable to advance it further due to lack of support  due to the tortuousness of the  aortoiliac system.   During the procedure, he developed intermittent 2:1 AV block (type 2).  This was asymptomatic.   HEMODYNAMICS:  Right atrial pressure mean of 2, RV 32/0, PA 30/9 with a  mean of 15.  Pulmonary capillary wedge pressure mean of 17.  Central  aortic pressure was 190/69 with a mean of 108.  LV pressure was 208/25  with EDP of 19.   Left main was calcified with mild plaquing.   LAD long vessel coursing to the apex gave off two diagonals.  It was  heavily calcified through the proximal and mid portion with a 30-40%  stenosis throughout the proximal portion and a 40% lesion in the  midsection.   Left circumflex was a large vessel that gave off a small ramus, small OM-  1.  There was a large OM-2 which was totally occluded proximally and a  moderate-sized OM-3.  The mid-to-distal portion of the OM-2 flow from  left-to-left collaterals.   Right coronary artery was a diffusely diseased vessel with a 70-80%  stenosis throughout the proximal and mid  portion.  It was then totally  occluded in the mid portion after an RV branch.  There was significant  left-to-right collaterals to the proximal and distal RCA off the  circumflex.  There was also fine bridging collaterals.   Left ventriculogram showed an EF of 55% with no regional wall motion  abnormalities.   Abdominal aortogram showed patent renal arteries bilaterally with just  mild plaquing.  There was a small-to-moderate size infrarenal abdominal  aortic aneurysm with high-grade left iliac disease.  There was moderate  right iliac disease.   ASSESSMENT:  1. Two-vessel coronary artery disease as described above.  2. Normal left ventricular function.  3. Severe peripheral arterial disease with abdominal aortic aneurysm      and high-grade left iliac disease.  4. Symptomatic bradycardia with intermittent 2:1 atrioventricular      block type 2.  5. Mild aortic stenosis with a mean gradient of 13 mmHg.   PLAN:   Medical therapy of his coronary artery disease.  I agree with Dr.  Daleen Squibb.  He will likely need a pacemaker.  He also wanted to follow up in  the [facility] with Dr. Excell Seltzer.      Bevelyn Buckles. Bensimhon, MD  Electronically Signed     DRB/MEDQ  D:  11/29/2007  T:  11/30/2007  Job:  [id number]

## 2010-07-12 NOTE — Letter (Signed)
December 04, 2007    John Sans. Wall, MD, FACC  [address]  [address]  [place], [place] [address]   RE:  John Farmer  MRN:  [id number]  /  DOB:  February 07, 1931   Dear John Farmer,   It was my pleasure to see your patient, John Farmer in EP consultation  today.  As you are aware, John Farmer is a very pleasant 77 year old  gentleman with a history of coronary artery disease, peripheral vascular  disease, and mild aortic stenosis.  He recently was evaluated by you for  symptoms of dizziness and bradycardia.  He underwent left heart  catheterization by Dr. Nicholes Mango on October 2 which revealed total  occlusion of the mid right coronary artery, has also occlusion of OM2  with good collateralization of these branches.  His calcification as  well as nonobstructive coronary artery disease was also noted within the  left anterior descending.  The ejection fraction was 55%.  An abdominal  aortogram revealed a small-to-moderate size infrarenal abdominal aortic  aneurysm with high-grade left iliac disease and moderate right iliac  disease.  During the procedure, the patient was noted to have  intermittent 2:1 AV block.  He therefore presents today for further EP  consultation.  The patient denies chest discomfort, shortness of breath  or palpitations.  He has had frequent episodes of dizziness which are  worse with sudden movements.  He also reports decreased exercise  capacity.  He denies near syncope or syncope.  A Holter monitor was  placed by you several weeks ago, which revealed primarily sinus  bradycardia with heart rates as low as 37 beats per minute.  He is  otherwise without complaint today.   PAST MEDICAL HISTORY:  1. Coronary artery disease (as above).  2. Peripheral vascular disease (as above).  3. Hypertension.  4. Hyperlipidemia.  5. Chronic tobacco use.   Transthoracic echocardiogram performed November 21, 2007, revealed an  ejection fraction of 55-60% with no left ventricular  Farmer motion  abnormalities.  The aortic valve was moderately calcified with a mean  transaortic gradient of 28 mmHg and an estimated valve area of 1.35 sq.  cm.   MEDICATIONS:  1. Aspirin 81 mg daily.  2. Zocor 20 mg daily.  3. Amlodipine 5 mg daily.  4. Glucosamine/chondroitin daily.   ALLERGIES:  PENICILLIN and MYCIN DRUGS.   SOCIAL HISTORY:  The patient lives in [place].  He has smoked one-  pack per day for more than 60 years.  He drinks two scotch drinks per  day.  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   FAMILY HISTORY:  Negative for coronary artery disease.   REVIEW OF SYSTEMS:  All systems were reviewed and negative except as  outlined in the HPI above.   PHYSICAL EXAMINATION:  VITALS:  Blood pressure 170/78, heart rate 41,  respirations 18, weight 170 pounds.  GENERAL:  The patient is a well-appearing elderly male in no acute  distress.  He is alert and oriented x3.  HEENT:  Normocephalic, atraumatic.  Sclerae clear.  Conjunctivae pink.  Oropharynx clear.  NECK:  Supple.  No JVD, lymphadenopathy or bruits.  LUNGS:  Clear to auscultation bilaterally.  HEART:  Regular bradycardic rhythm, 2/6 systolic ejection murmur at the  left upper sternal border.  GASTROINTESTINAL:  Soft, nontender, nondistended.  Positive bowel  sounds.  EXTREMITIES:  No clubbing, cyanosis or edema.  NEUROLOGIC:  Cranial nerves II through XII intact.  Strength and  sensation are intact.  MUSCULOSKELETAL:  No deformity or atrophy.  PSYCHIATRIC:  Euthymic mood, full affect.  SKIN:  No ecchymosis or lacerations.   EKG today reveals sinus rhythm at 80 beats per minute with second-degree  AV heart block and 2:1 conduction.  A right bundle-branch, left  posterior fascicular pattern is noted.   IMPRESSION:  John Farmer is a very pleasant 77 year old gentleman with  coronary artery disease, peripheral vascular disease and hypertension  who now presents for EP consultation.  Upon  arrival, the patient was  found to have 2:1 second-degree AV block which is suggestive of  degenerative conduction disease.   PLAN:  Symptomatic degenerative conduction disease was discussed in  detail with the patient today in clinic by myself and Dr. Lewayne Bunting.  The risk of syncope were also discussed.  We were both very clear with  the patient that he should not drive until a pacemaker was implanted due  to his risk for syncope.  The risks, benefits and alternatives to dual-  chamber pacemaker implantation were discussed in detail by both me and  Dr. Ladona Ridgel in clinic today.  These risks include but are not limited to  infection, bleeding, pneumothorax, pericardial effusion with tamponade  and stroke.  The patient understands these risks and wishes to proceed  with pacemaker implantation.  We will therefore schedule the patient for  pacemaker implantation at the next available date.  He is aware that he  should not drive in the interim.   Thank you for allowing me to participate in the care of this gentleman.    Sincerely,      Hillis Range, MD  Electronically Signed    JA/MedQ  DD: 12/04/2007  DT: 12/05/2007  Job #: [id number]   CC:   [name], MD  Doylene Canning. Ladona Ridgel, MD

## 2010-07-12 NOTE — Op Note (Signed)
NAMEGUSSIE, TOWSON NO.:  [id number]   MEDICAL RECORD NO.:  [id number]          PATIENT TYPE:  OIB   LOCATION:  3737                         FACILITY:  [facility]   PHYSICIAN:  Doylene Canning. Ladona Ridgel, MD    DATE OF BIRTH:  12/28/1930   DATE OF PROCEDURE:  12/05/2007  DATE OF DISCHARGE:                               OPERATIVE REPORT   PROCEDURE PERFORMED:  Implantation of a dual-chamber pacemaker.   INDICATIONS:  Symptomatic 2:1 heart block.   INTRODUCTION:  The patient is a 77 year old man with a history of  bradycardia and dizziness and palpitations.  He underwent  catheterization demonstrating preserved LV function and moderate aortic  stenosis.  He had preserved LV function.  The patient was found be in  2:1 heart block with a ventricular rate of between 30-40 beats per  minute.  His average rate was approximately 37 beats per minute.  He is  now referred for dual-chamber pacemaker insertion.   PROCEDURE:  After informed consent was obtained, the patient was taken  to the Diagnostic EP Lab in a fasting state.  After usual preparation  and draping, intravenous fentanyl and midazolam was given for sedation.  Lidocaine 30 mL was infiltrated into the left infraclavicular region.  A  5-cm incision was carried out over this region.  Electrocautery was  utilized to dissect down the fascial plane.  The left subclavian vein  was subsequently punctured x2 and the St. Jude model 1788T, 52-cm active  fixation pacing lead serial #[id number] was advanced in the right  ventricle.  The St. Jude model [id number], 46-cm active fixation pacing lead  serial number [id number] was advanced through the right atrium.  Mapping  was carried out at the final site.  The R-waves measured 22, the  impedance 700 ohms, threshold was 1 volt at 0.5 milliseconds.  A minimal  injury current was noted.  The 10-volt pacing did not stimulate the  diaphragm.  With the ventricular lead in satisfactory position,  attention was turned to placement of the atrial lead.  The St. Jude  [id number], 46-cm active fixation atrial lead was placed in the anterolateral  wall of the right atrium where P-waves measured 4 mV, the impedance was  700 ohms, and threshold was 0.8 volts at 0.5 milliseconds.  The 10-volt  pacing again did not stimulate the diaphragm and no injury current was  noted.  With both the atrial and ventricular leads in satisfactory  position, they were secured to subpectoralis fascia with a figure-of-  eight silk suture.  The sewing sleeve was also secured with the silk  suture.  The St. Jude Zephyr XL DR dual-chamber pacemaker, serial  [id number] was connected to the atrial and ventricular leads, and placed  back in the subcutaneous pocket.  Generator was secured with silk  suture.  Additional gentamicin was utilized to irrigate the pocket.  The  incision closed with 2-0 Vicryl followed by a layer of 3-0 Vicryl.  Benzoin was painted on the skin, Steri-Strips were applied, and a  pressure dressing was placed.  The patient was returned to his room in  satisfactory condition.  Of note, this was actually bacitracin that she  is to irrigate the pocket.   COMPLICATIONS:  There were no immediate procedure complications.   RESULTS:  This demonstrates successful implantation of a St. Jude dual-  chamber pacemaker in a patient with symptomatic 2:1 heart block.      Doylene Canning. Ladona Ridgel, MD  Electronically Signed     GWT/MEDQ  D:  12/06/2007  T:  12/07/2007  Job:  [id number]   cc:   Thomas C. Daleen Squibb, MD, [facility]  [name], MD

## 2010-07-15 NOTE — Op Note (Signed)
NAME:  John Farmer, HENNINGTON                        ACCOUNT NO.:  [id number]   MEDICAL RECORD NO.:  [id number]                   PATIENT TYPE:  INP   LOCATION:  NA                                   FACILITY:  [facility]   PHYSICIAN:  Danae Orleans. Venetia Maxon, M.D.               DATE OF BIRTH:  11/15/1930   DATE OF PROCEDURE:  10/18/2001  DATE OF DISCHARGE:                                 OPERATIVE REPORT   PREOPERATIVE DIAGNOSIS:  Herniated lumbar disk with spondylosis,  degenerative disk disease, and lumbar radiculopathy, L4-5 right.   POSTOPERATIVE DIAGNOSIS:  Herniated lumbar disk with spondylosis,  degenerative disk disease, and lumbar radiculopathy, L4-5 right.   PROCEDURE:  Hemisemilaminotomy with microdiskectomy, L4-5 right, with  microdissection.   SURGEON:  Danae Orleans. Venetia Maxon, M.D.   ASSISTANT:  Stefani Dama, M.D.   ANESTHESIA:  General endotracheal anesthesia.   ESTIMATED BLOOD LOSS:  Minimal.   COMPLICATIONS:  None.   DISPOSITION:  To recovery.   INDICATIONS:  The patient is a 77 year old man with a large free-fragment  disk herniation at the L4-5 level on the right with compression of the right  L5 nerve root and significant L5 radiculopathy.  He had previously undergone  right L5-S1 microdiskectomy by another surgeon in the distant past.  His L5  radiculopathy is why he is taken to surgery for microdiskectomy.   DESCRIPTION OF PROCEDURE:  The patient was brought to the operating room.  Following the satisfactory and uncomplicated induction of general  endotracheal anesthesia and placement of intravenous lines, the patient was  placed in the prone position on the Wilson frame.  His low back was prepped  and draped in the usual sterile fashion.  The area of planned incision was  infiltrated with 0.25% Marcaine and 1% lidocaine with 1:200,000 epinephrine.  An incision was made in the midline and carried through the subcutaneous  tissues to the lumbodorsal fascia, which was  incised to the right side of  the midline at the L4-5 interspace.  Subperiosteal dissection was performed  and the L4-5 interspace was exposed.  Intraoperative x-ray confirmed the  correct level.  Using the Anspach drill and AM8 equivalent bur, a  hemisemilaminotomy at L4 was performed.  Subsequently this laminotomy defect  was completed with the Kerrison rongeurs, as was a foraminotomy overlying  the superior aspect of the L5 lamina.  The ligamentum flavum was then  detached and removed in a piecemeal fashion.  The lateral recess was  decompressed as well.  The microscope was brought into the field and using  high-power microscopic visualization, the L5 nerve root was identified, as  was the common dural tube, and these were dissected medially, exposing a  very large contained free fragment of herniated disk material, which was  directly beneath the takeoff of the L5 nerve root and extended out into the  neural foramen, compressing the L5 nerve root.  A  thin layer of annulus  overlying the structure was then removed and the multiple large fragments of  herniated disk material were removed.  The interspace was found to be  significantly protruding as well, and the fragments of disk appeared to have  a tail down into the interspace, so consequently it was elected to incise  this and to evacuate the right side of the interspace as well.  This was  done with a variety of pituitary rongeurs and Epstein curettes and resulted  in excellent decompression of the thecal sac and the lateral recess.  Hemostasis was assured with bipolar electrocautery.  The coronary dilator  was easily inserted underneath the medial aspect of the canal and over and  under the L5 nerve root without any encumbrance.  The wound was then  copiously irrigated with bacitracin and saline.  The self-retaining  retractor was removed, the microscope was taken out of the field.  The  lumbodorsal fascia was closed with 0 Vicryl  suture, the subcutaneous tissues  were reapproximated with 2-0 Vicryl interrupted inverted sutures, and the  skin edges reapproximated with interrupted 3-0 Vicryl subcuticular stitch.  The wound was dressed with Dermabond.  The patient was extubated in the  operating room and taken to the recovery room in stable and satisfactory  condition.  All counts were correct at the end of the case.                                               Danae Orleans. Venetia Maxon, M.D.    JDS/MEDQ  D:  10/18/2001  T:  10/22/2001  Job:  [id number]

## 2010-07-26 ENCOUNTER — Other Ambulatory Visit: Payer: Self-pay | Admitting: *Deleted

## 2010-07-26 MED ORDER — AMLODIPINE BESYLATE 10 MG PO TABS: 10.0000 mg | ORAL_TABLET | Freq: Every day | ORAL | Status: DC

## 2010-09-08 ENCOUNTER — Encounter: Payer: Self-pay | Admitting: Internal Medicine

## 2010-09-14 ENCOUNTER — Ambulatory Visit (INDEPENDENT_AMBULATORY_CARE_PROVIDER_SITE_OTHER): Payer: Medicare Other | Admitting: Internal Medicine

## 2010-09-14 ENCOUNTER — Encounter: Payer: Self-pay | Admitting: Internal Medicine

## 2010-09-14 DIAGNOSIS — I441 Atrioventricular block, second degree: Secondary | ICD-10-CM

## 2010-09-14 DIAGNOSIS — I10 Essential (primary) hypertension: Secondary | ICD-10-CM

## 2010-09-14 DIAGNOSIS — I442 Atrioventricular block, complete: Secondary | ICD-10-CM

## 2010-09-14 DIAGNOSIS — F17200 Nicotine dependence, unspecified, uncomplicated: Secondary | ICD-10-CM

## 2010-09-14 DIAGNOSIS — I739 Peripheral vascular disease, unspecified: Secondary | ICD-10-CM

## 2010-09-14 NOTE — Assessment & Plan Note (Signed)
He is very clear that he is not willing to quit.

## 2010-09-14 NOTE — Assessment & Plan Note (Signed)
Stable No change required today  Follow-up with Dr Excell Seltzer.

## 2010-09-14 NOTE — Progress Notes (Signed)
The patient presents today for routine electrophysiology followup.  Since last being seen in our clinic, the patient reports doing very well.  Today, he denies symptoms of palpitations, chest pain, shortness of breath, orthopnea, PND, lower extremity edema, dizziness, presyncope, syncope, or neurologic sequela.  His claudication has improved.  The patient feels that he is tolerating medications without difficulties and is otherwise without complaint today.   Past Medical History  Diagnosis Date  . HTN (hypertension)   . ED (erectile dysfunction)   . CAD (coronary artery disease)   . Tobacco abuse   . 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 HLD (hyperlipidemia)   . Complete heart block 12/05/07    s/p PPM  . Moderate asthma   . Tobacco abuse    Past Surgical History  Procedure Date  . Lumbar hnp   . Tonsillectomy   . Pacemaker insertion 12/05/07    by Fawn Kirk    Current Outpatient Prescriptions  Medication Sig Dispense Refill  . amLODipine (NORVASC) 10 MG tablet Take 1 tablet (10 mg total) by mouth daily.  30 tablet  6  . aspirin 81 MG tablet Take 81 mg by mouth daily.        . Bromelains (BROMELAIN PO) Take by mouth daily.        Marland Kitchen CINNAMON PO Take by mouth daily.        . Ginkgo 60 MG TABS Take by mouth daily.        . Red Yeast Rice POWD by Does not apply route daily.        . simvastatin (ZOCOR) 20 MG tablet Take 20 mg by mouth at bedtime.          Allergies  Allergen Reactions  . Penicillins     REACTION: Severe Hives    History   Social History  . Marital Status: Single    Spouse Name: N/A    Number of Children: N/A  . Years of Education: N/A   Occupational History  . Not on file.   Social History Main Topics  . Smoking status: Smoker, Current Status Unknown  . Smokeless tobacco: Not on file   Comment: Smoked 1 ppd for more than 60 years, very clear that he will not quit.  . Alcohol Use: Yes    drinks 2 scotch drinks per day  . Drug Use: Not on file  . Sexually Active: Not on file   Other Topics Concern  . 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     Family History  Problem Relation Age of Onset  . Coronary artery disease Other     male 1st degree relative <60  . Skin cancer Other   . Stroke Other     F  1st degree relative <60; M 1st degree relative <50  . Thyroid disease Other    Physical Exam: Filed Vitals:   09/14/10 1155  BP: 123/81  Pulse: 69  Height: 5\' 7"  (1.702 m)  Weight: 171 lb (77.565 kg)    GEN- The patient is well appearing, alert and oriented x 3 today.   Head- normocephalic, atraumatic Eyes-  Sclera clear, conjunctiva pink Ears- hearing intact Oropharynx- clear Neck- supple, no JVP, + bilateral bruits Lymph- no cervical lymphadenopathy Lungs- Clear to ausculation bilaterally, normal work of breathing Chest- pacemaker pocket is well healed Heart- Regular rate  and rhythm, 3/6 SEM LUSB GI- soft, NT, ND, + BS Extremities- no clubbing, cyanosis, or edema MS- no significant deformity or atrophy Skin- no rash or lesion Psych- euthymic mood, full affect Neuro- strength and sensation are intact  Pacemaker interrogation- reviewed in detail today,  See PACEART report  Assessment and Plan:

## 2010-09-14 NOTE — Assessment & Plan Note (Signed)
Normal pacemaker function See Pace Art report No changes today  

## 2010-09-14 NOTE — Assessment & Plan Note (Signed)
Stable No change required today  

## 2010-09-14 NOTE — Patient Instructions (Signed)
Your physician wants you to follow-up in: 6 months in the device clinic and 12 months with Dr Allred You will receive a reminder letter in the mail two months in advance. If you don't receive a letter, please call our office to schedule the follow-up appointment.  

## 2010-11-28 LAB — POCT I-STAT 3, VENOUS BLOOD GAS (G3P V)
Acid-Base Excess: 2
Bicarbonate: 23.1
Bicarbonate: 27.8 — ABNORMAL HIGH
O2 Saturation: 66
TCO2: 24
TCO2: 29
pCO2, Ven: 42.1 — ABNORMAL LOW
pH, Ven: 7.348 — ABNORMAL HIGH

## 2010-11-28 LAB — POCT I-STAT 3, ART BLOOD GAS (G3+)
O2 Saturation: 96
TCO2: 27
pCO2 arterial: 39.4
pO2, Arterial: 79 — ABNORMAL LOW

## 2011-01-20 ENCOUNTER — Other Ambulatory Visit: Payer: Self-pay | Admitting: *Deleted

## 2011-01-20 MED ORDER — SIMVASTATIN 20 MG PO TABS: 20.0000 mg | ORAL_TABLET | Freq: Every day | ORAL | Status: DC

## 2011-02-24 ENCOUNTER — Other Ambulatory Visit: Payer: Self-pay

## 2011-02-24 MED ORDER — AMLODIPINE BESYLATE 10 MG PO TABS: 10.0000 mg | ORAL_TABLET | Freq: Every day | ORAL | Status: DC

## 2011-02-24 NOTE — Telephone Encounter (Signed)
..   Requested Prescriptions   Signed Prescriptions Disp Refills  . amLODipine (NORVASC) 10 MG tablet 30 tablet 11    Sig: Take 1 tablet (10 mg total) by mouth daily.    Authorizing Provider: Hillis Range D    Ordering User: Lacie Scotts

## 2011-03-23 ENCOUNTER — Encounter: Payer: Self-pay | Admitting: *Deleted

## 2011-04-05 ENCOUNTER — Ambulatory Visit (INDEPENDENT_AMBULATORY_CARE_PROVIDER_SITE_OTHER): Payer: Medicare Other | Admitting: *Deleted

## 2011-04-05 ENCOUNTER — Encounter: Payer: Self-pay | Admitting: Internal Medicine

## 2011-04-05 DIAGNOSIS — I442 Atrioventricular block, complete: Secondary | ICD-10-CM

## 2011-04-05 LAB — PACEMAKER DEVICE OBSERVATION
AL AMPLITUDE: 2.7 mv
AL IMPEDENCE PM: 408 Ohm
AL THRESHOLD: 0.5 V
BAMS-0003: 70 {beats}/min
RV LEAD IMPEDENCE PM: 445 Ohm
RV LEAD THRESHOLD: 0.75 V

## 2011-04-05 NOTE — Progress Notes (Signed)
PPM check 

## 2011-09-01 ENCOUNTER — Other Ambulatory Visit: Payer: Self-pay | Admitting: *Deleted

## 2011-09-01 MED ORDER — SIMVASTATIN 20 MG PO TABS: 20.0000 mg | ORAL_TABLET | Freq: Every day | ORAL | Status: DC

## 2011-09-01 NOTE — Telephone Encounter (Signed)
Refilled simvastatin.

## 2011-09-25 ENCOUNTER — Encounter: Payer: Self-pay | Admitting: Internal Medicine

## 2011-09-25 ENCOUNTER — Ambulatory Visit (INDEPENDENT_AMBULATORY_CARE_PROVIDER_SITE_OTHER): Payer: Medicare Other | Admitting: Internal Medicine

## 2011-09-25 VITALS — BP 122/80 | HR 81 | Resp 18 | Ht 67.0 in | Wt 168.4 lb

## 2011-09-25 DIAGNOSIS — I442 Atrioventricular block, complete: Secondary | ICD-10-CM

## 2011-09-25 DIAGNOSIS — I2510 Atherosclerotic heart disease of native coronary artery without angina pectoris: Secondary | ICD-10-CM

## 2011-09-25 DIAGNOSIS — I714 Abdominal aortic aneurysm, without rupture: Secondary | ICD-10-CM

## 2011-09-25 DIAGNOSIS — I359 Nonrheumatic aortic valve disorder, unspecified: Secondary | ICD-10-CM

## 2011-09-25 DIAGNOSIS — I10 Essential (primary) hypertension: Secondary | ICD-10-CM

## 2011-09-25 DIAGNOSIS — F17200 Nicotine dependence, unspecified, uncomplicated: Secondary | ICD-10-CM

## 2011-09-25 NOTE — Patient Instructions (Addendum)
Your physician wants you to follow-up in: next available with Dr Excell Seltzer, 6 months with Device clinic and 12 months with Dr Johney Frame Bonita Quin will receive a reminder letter in the mail two months in advance. If you don't receive a letter, please call our office to schedule the follow-up appointment.  .  Your physician has requested that you have an echocardiogram. Echocardiography is a painless test that uses sound waves to create images of your heart. It provides your doctor with information about the size and shape of your heart and how well your heart's chambers and valves are working. This procedure takes approximately one hour. There are no restrictions for this procedure.

## 2011-09-25 NOTE — Assessment & Plan Note (Signed)
Cessation is advised He is clear that he will not quit

## 2011-09-25 NOTE — Assessment & Plan Note (Signed)
Stable No change required today  

## 2011-09-25 NOTE — Progress Notes (Signed)
PCP: John Georges, MD Primary Cardiologist:  Dr Excell Seltzer  The patient presents today for routine electrophysiology followup.  Since last being seen in our clinic, the patient reports doing very well.  Today, he denies symptoms of palpitations, chest pain, shortness of breath, orthopnea, PND, lower extremity edema, dizziness, presyncope, syncope, or neurologic sequela.  His claudication has improved.  The patient feels that he is tolerating medications without difficulties and is otherwise without complaint today.   Past Medical History  Diagnosis Date  . HTN (hypertension)   . ED (erectile dysfunction)   . CAD (coronary artery disease)   . Tobacco abuse   . 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 HLD (hyperlipidemia)   . Complete heart block 12/05/07    s/p PPM  . Moderate asthma   . Tobacco abuse    Past Surgical History  Procedure Date  . Lumbar hnp   . Tonsillectomy   . Pacemaker insertion 12/05/07    by Fawn Kirk    Current Outpatient Prescriptions  Medication Sig Dispense Refill  . amLODipine (NORVASC) 10 MG tablet Take 1 tablet (10 mg total) by mouth daily.  30 tablet  11  . aspirin 81 MG tablet Take 81 mg by mouth daily.        . Bromelains (BROMELAIN PO) Take by mouth daily.        Marland Kitchen CINNAMON PO Take by mouth daily.        . folic acid (FOLVITE) 1 MG tablet Take 1 mg by mouth daily.      . Ginkgo 60 MG TABS Take by mouth daily.        . simvastatin (ZOCOR) 20 MG tablet Take 1 tablet (20 mg total) by mouth at bedtime.  30 tablet  6    Allergies  Allergen Reactions  . Penicillins     REACTION: Severe Hives    History   Social History  . Marital Status: Single    Spouse Name: N/A    Number of Children: N/A  . Years of Education: N/A   Occupational History  . Not on file.   Social History Main Topics  . Smoking status: Smoker, Current Status Unknown  . Smokeless tobacco: Not on file   Comment: Smoked 1 ppd for more than 60 years, very clear that he will not quit.  . Alcohol Use: Yes     drinks 2 scotch drinks per day  . Drug Use: Not on file  . Sexually Active: Not on file   Other Topics Concern  . 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     Family History  Problem Relation Age of Onset  . Coronary artery disease Other     male 1st degree relative <60  . Skin cancer Other   . Stroke Other     F  1st degree relative <60; M 1st degree relative <50  . Thyroid disease Other    Physical Exam: Filed Vitals:   09/25/11 1031  BP: 122/80  Pulse: 81  Resp: 18  Height: 5\' 7"  (1.702 m)  Weight: 168 lb 6.4 oz (76.386 kg)  SpO2: 97%    GEN- The patient is well appearing, alert and oriented x 3 today.   Head- normocephalic, atraumatic Eyes-  Sclera clear, conjunctiva pink Ears- hearing intact Oropharynx- clear Neck- supple, no JVP, + bilateral bruits Lymph-  no cervical lymphadenopathy Lungs- Clear to ausculation bilaterally, normal work of breathing Chest- pacemaker pocket is well healed Heart- Regular rate and rhythm, 3/6 SEM LUSB (late peaking) GI- soft, NT, ND, + BS Extremities- no clubbing, cyanosis, or edema  Pacemaker interrogation- reviewed in detail today,  See PACEART report  Assessment and Plan:

## 2011-09-25 NOTE — Assessment & Plan Note (Signed)
Moderate by echo 2011.  At least moderate by exam (possibly severe) Repeat echo and follow-up with Dr Excell Seltzer

## 2011-09-25 NOTE — Assessment & Plan Note (Signed)
Normal pacemaker function See Pace Art report No changes today  

## 2011-09-25 NOTE — Assessment & Plan Note (Signed)
Overdue for follow-up with Dr Excell Seltzer I have instructed him to see Dr Excell Seltzer at next available time.

## 2011-10-12 ENCOUNTER — Ambulatory Visit (HOSPITAL_COMMUNITY): Payer: Medicare Other | Attending: Cardiology

## 2011-10-12 ENCOUNTER — Other Ambulatory Visit: Payer: Self-pay

## 2011-10-12 DIAGNOSIS — I2510 Atherosclerotic heart disease of native coronary artery without angina pectoris: Secondary | ICD-10-CM | POA: Insufficient documentation

## 2011-10-12 DIAGNOSIS — I080 Rheumatic disorders of both mitral and aortic valves: Secondary | ICD-10-CM | POA: Insufficient documentation

## 2011-10-12 DIAGNOSIS — I359 Nonrheumatic aortic valve disorder, unspecified: Secondary | ICD-10-CM

## 2011-10-12 DIAGNOSIS — I714 Abdominal aortic aneurysm, without rupture: Secondary | ICD-10-CM

## 2011-10-12 DIAGNOSIS — I739 Peripheral vascular disease, unspecified: Secondary | ICD-10-CM | POA: Insufficient documentation

## 2011-10-12 DIAGNOSIS — E785 Hyperlipidemia, unspecified: Secondary | ICD-10-CM | POA: Insufficient documentation

## 2011-10-12 DIAGNOSIS — F17200 Nicotine dependence, unspecified, uncomplicated: Secondary | ICD-10-CM | POA: Insufficient documentation

## 2011-10-12 DIAGNOSIS — I10 Essential (primary) hypertension: Secondary | ICD-10-CM | POA: Insufficient documentation

## 2011-10-12 DIAGNOSIS — I079 Rheumatic tricuspid valve disease, unspecified: Secondary | ICD-10-CM | POA: Insufficient documentation

## 2011-10-12 DIAGNOSIS — I442 Atrioventricular block, complete: Secondary | ICD-10-CM

## 2011-10-12 NOTE — Progress Notes (Signed)
Echocardiogram performed.  

## 2011-10-16 ENCOUNTER — Encounter (INDEPENDENT_AMBULATORY_CARE_PROVIDER_SITE_OTHER): Payer: Medicare Other

## 2011-10-16 DIAGNOSIS — I723 Aneurysm of iliac artery: Secondary | ICD-10-CM

## 2011-10-16 DIAGNOSIS — I700 Atherosclerosis of aorta: Secondary | ICD-10-CM

## 2011-10-16 DIAGNOSIS — I714 Abdominal aortic aneurysm, without rupture: Secondary | ICD-10-CM

## 2011-11-21 ENCOUNTER — Encounter: Payer: Self-pay | Admitting: Cardiovascular Disease

## 2011-11-21 ENCOUNTER — Ambulatory Visit (INDEPENDENT_AMBULATORY_CARE_PROVIDER_SITE_OTHER): Payer: Medicare Other | Admitting: Cardiovascular Disease

## 2011-11-21 VITALS — BP 134/81 | HR 72 | Ht 67.0 in | Wt 166.4 lb

## 2011-11-21 DIAGNOSIS — I359 Nonrheumatic aortic valve disorder, unspecified: Secondary | ICD-10-CM

## 2011-11-21 DIAGNOSIS — I714 Abdominal aortic aneurysm, without rupture: Secondary | ICD-10-CM

## 2011-11-21 NOTE — Patient Instructions (Addendum)
Your physician has requested that you have an echocardiogram in August 2014. Echocardiography is a painless test that uses sound waves to create images of your heart. It provides your doctor with information about the size and shape of your heart and how well your heart's chambers and valves are working. This procedure takes approximately one hour. There are no restrictions for this procedure.  Your physician has requested that you have an abdominal aorta duplex in August 2014. During this test, an ultrasound is used to evaluate the aorta. Allow 30 minutes for this exam. Do not eat after midnight the day before and avoid carbonated beverages  Your physician recommends that you continue on your current medications as directed. Please refer to the Current Medication list given to you today.  Your physician wants you to follow-up in: 1 YEAR.  You will receive a reminder letter in the mail two months in advance. If you don't receive a letter, please call our office to schedule the follow-up appointment.

## 2011-11-21 NOTE — Progress Notes (Signed)
HPI:  78 year old gentleman presenting for followup evaluation. The patient has aortic stenosis, a small abdominal aortic aneurysm, and lower extremity peripheral arterial disease. He continues to smoke cigarettes without interested in quitting. He last saw Dr. Johney Frame in July and his pacemaker was functioning normally at that time.  From a symptomatic perspective, the patient states he is doing fine. He denies chest pain, chest pressure, dyspnea, edema, or palpitations. He states that he "paces himself" and has no problems. He is really not interested in pursuing any aggressive treatments at this point if possible. He continues to work at his job and enjoys that very much.  Outpatient Encounter Prescriptions as of 11/21/2011  Medication Sig Dispense Refill  . amLODipine (NORVASC) 10 MG tablet Take 1 tablet (10 mg total) by mouth daily.  30 tablet  11  . aspirin 81 MG tablet Take 81 mg by mouth daily.        . Bromelains (BROMELAIN PO) Take by mouth daily.        Marland Kitchen CINNAMON PO Take by mouth daily.        . folic acid (FOLVITE) 1 MG tablet Take 1 mg by mouth daily.      . Ginkgo 60 MG TABS Take by mouth daily.        . simvastatin (ZOCOR) 20 MG tablet Take 1 tablet (20 mg total) by mouth at bedtime.  30 tablet  6    Allergies  Allergen Reactions  . Penicillins     REACTION: Severe Hives    Past Medical History  Diagnosis Date  . HTN (hypertension)   . ED (erectile dysfunction)   . CAD (coronary artery disease)   . Tobacco abuse   . 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 HLD (hyperlipidemia)   . Complete heart block 12/05/07    s/p PPM  . Moderate asthma   . Tobacco abuse     ROS: Negative except as per HPI  BP 134/81  Pulse 72  Ht 5\' 7"  (1.702 m)  Wt 75.479 kg (166 lb 6.4 oz)  BMI 26.06 kg/m2  PHYSICAL EXAM: Pt is alert and oriented, pleasant elderly male in NAD HEENT: normal Neck: JVP - normal,  carotids 2+= with bilateral bruits Lungs: CTA bilaterally CV: RRR with grade 3/6 crescendo decrescendo murmur at the right upper sternal border Abd: soft, NT, Positive BS, no hepatomegaly Ext: no C/C/E, distal pulses diminished Skin: warm/dry no rash  EKG:  Sinus rhythm with ventricular pacing  2D ECHO: Study Conclusions  - Left ventricle: Mild septal dyssynergy (pacer) The cavity size was normal. Wall thickness was increased in a pattern of moderate LVH. The estimated ejection fraction was 60%. Wall motion was normal; there were no regional wall motion abnormalities. - Aortic valve: Peak velocity 3.40m/sec. Calcified valve. Moderately severe AS. Mild AI. Mean gradient: 35mm Hg (S). Peak gradient: 59mm Hg (S). - Mitral valve: Mild regurgitation. - Right ventricle: Pacer wire or catheter noted in right ventricle. - Pulmonary arteries: PA peak pressure: 32mm Hg (S). - Impressions: There has been significant progression of Aortic stenosis since 2009. Impressions: - There has been significant progression of Aortic stenosis since 2009.  ASSESSMENT AND PLAN: 1. Aortic stenosis, moderate to severe. He is gradients are not in the severe range but they are approaching it. He has minimal symptoms and is not particularly interested in pursuing aggressive treatments at this point. We had a discussion  about the natural history of aortic stenosis and that he will likely develop symptoms over the next few years. He understands this. I have recommended a followup echocardiogram in 12 months and an office visit to follow. He will contact me if he develops chest pain or pressure, dyspnea with exertion, or lightheadedness/syncope.  2. Peripheral arterial disease with abdominal aortic aneurysm and intermittent claudication. He has minimal symptoms at present. He continues to smoke and has no interest in quitting. He will remain on antiplatelet therapy with aspirin 81 mg daily and lipid-lowering with  simvastatin.  3. Coronary artery disease, native vessel. Stable without anginal symptoms. Continue current medical program. Cardiac catheterization report from 2009 was reviewed and he had moderate diffuse disease of the right coronary artery, mild disease of the LAD, and total occlusion of the obtuse marginal branches of the circumflex with left to left collaterals.  Tonny Bollman 11/21/2011 1:46 PM

## 2012-02-23 ENCOUNTER — Other Ambulatory Visit: Payer: Self-pay | Admitting: *Deleted

## 2012-02-23 MED ORDER — AMLODIPINE BESYLATE 10 MG PO TABS: 10.0000 mg | ORAL_TABLET | Freq: Every day | ORAL | Status: DC

## 2012-03-22 ENCOUNTER — Encounter: Payer: Self-pay | Admitting: Cardiology

## 2012-03-22 ENCOUNTER — Ambulatory Visit (INDEPENDENT_AMBULATORY_CARE_PROVIDER_SITE_OTHER): Payer: Medicare Other | Admitting: Cardiology

## 2012-03-22 DIAGNOSIS — I442 Atrioventricular block, complete: Secondary | ICD-10-CM

## 2012-03-22 DIAGNOSIS — Z950 Presence of cardiac pacemaker: Secondary | ICD-10-CM

## 2012-03-22 LAB — PACEMAKER DEVICE OBSERVATION
AL THRESHOLD: 0.5 V
BAMS-0001: 150 {beats}/min
RV LEAD AMPLITUDE: 12 mv
VENTRICULAR PACING PM: 91

## 2012-03-22 NOTE — Progress Notes (Signed)
No complaints. PPM check/device clinic only. See PaceArt report.

## 2012-03-22 NOTE — Patient Instructions (Signed)
Your physician wants you to follow-up in: 6 months with Dr. Allred. You will receive a reminder letter in the mail two months in advance. If you don't receive a letter, please call our office to schedule the follow-up appointment.  

## 2012-04-03 ENCOUNTER — Other Ambulatory Visit: Payer: Self-pay | Admitting: Emergency Medicine

## 2012-04-03 MED ORDER — SIMVASTATIN 20 MG PO TABS: 20.0000 mg | ORAL_TABLET | Freq: Every day | ORAL | Status: DC

## 2012-04-29 ENCOUNTER — Encounter: Payer: Self-pay | Admitting: Internal Medicine

## 2012-09-02 ENCOUNTER — Encounter: Payer: Self-pay | Admitting: Internal Medicine

## 2012-09-02 ENCOUNTER — Other Ambulatory Visit: Payer: Self-pay | Admitting: Internal Medicine

## 2012-09-02 ENCOUNTER — Ambulatory Visit (INDEPENDENT_AMBULATORY_CARE_PROVIDER_SITE_OTHER): Payer: Medicare Other | Admitting: Internal Medicine

## 2012-09-02 VITALS — BP 126/66 | HR 77 | Ht 67.0 in | Wt 169.0 lb

## 2012-09-02 DIAGNOSIS — I4891 Unspecified atrial fibrillation: Secondary | ICD-10-CM

## 2012-09-02 DIAGNOSIS — I10 Essential (primary) hypertension: Secondary | ICD-10-CM

## 2012-09-02 DIAGNOSIS — F17200 Nicotine dependence, unspecified, uncomplicated: Secondary | ICD-10-CM

## 2012-09-02 DIAGNOSIS — I498 Other specified cardiac arrhythmias: Secondary | ICD-10-CM

## 2012-09-02 DIAGNOSIS — I359 Nonrheumatic aortic valve disorder, unspecified: Secondary | ICD-10-CM

## 2012-09-02 DIAGNOSIS — I480 Paroxysmal atrial fibrillation: Secondary | ICD-10-CM | POA: Insufficient documentation

## 2012-09-02 DIAGNOSIS — I442 Atrioventricular block, complete: Secondary | ICD-10-CM

## 2012-09-02 DIAGNOSIS — I739 Peripheral vascular disease, unspecified: Secondary | ICD-10-CM

## 2012-09-02 DIAGNOSIS — I2510 Atherosclerotic heart disease of native coronary artery without angina pectoris: Secondary | ICD-10-CM

## 2012-09-02 MED ORDER — WARFARIN SODIUM 5 MG PO TABS: 5.0000 mg | ORAL_TABLET | Freq: Every day | ORAL | Status: DC

## 2012-09-02 NOTE — Patient Instructions (Addendum)
Your physician wants you to follow-up in: 6 months with John Farmer and 12 months with Dr Johney Frame.  You will receive a reminder letter in the mail two months in advance. If you don't receive a letter, please call our office to schedule the follow-up appointment. Your physician has recommended you make the following change in your medication: WARFARIN  5 MG  1 TAB EVERY EVENING  NEEDS COUMADIN CLINIC  APPT  ON [facility]  NEW COUMADIN PT

## 2012-09-02 NOTE — Progress Notes (Signed)
PCP: Delorse Lek, MD  John Farmer is a 79 y.o. male who presents today for routine electrophysiology followup.  Since last being seen in our clinic, the patient reports doing very well.   He is working as an Advertising account planner. He is still active with yardwork and trimming bushes.  He continues to smoke and is not willing to quit.  Today, he denies symptoms of palpitations, chest pain, shortness of breath,  lower extremity edema, dizziness, presyncope, or syncope.  The patient is otherwise without complaint today.   Past Medical History  Diagnosis Date  . HTN (hypertension)   . ED (erectile dysfunction)   . CAD (coronary artery disease)   . Tobacco abuse   . 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 HLD (hyperlipidemia)   . Complete heart block 12/05/07    s/p PPM  . Moderate asthma   . Tobacco abuse    Past Surgical History  Procedure Laterality Date  . Lumbar hnp    . Tonsillectomy    . Pacemaker insertion  12/05/07    by Fawn Kirk    Current Outpatient Prescriptions  Medication Sig Dispense Refill  . amLODipine (NORVASC) 10 MG tablet Take 1 tablet (10 mg total) by mouth daily.  30 tablet  11  . aspirin 81 MG tablet Take 81 mg by mouth daily.        Marland Kitchen CINNAMON PO Take by mouth daily.        . folic acid (FOLVITE) 1 MG tablet Take 1 mg by mouth daily.      . Ginkgo 60 MG TABS Take by mouth daily.        . simvastatin (ZOCOR) 20 MG tablet Take 1 tablet (20 mg total) by mouth at bedtime.  30 tablet  6  . Bromelains (BROMELAIN PO) Take by mouth daily. Pt unsure if he is taking this       No current facility-administered medications for this visit.    Physical Exam: Filed Vitals:   09/02/12 1010  BP: 126/66  Pulse: 77  Height: 5\' 7"  (1.702 m)  Weight: 169 lb (76.658 kg)    GEN- The patient is well appearing, alert and oriented x 3 today.   Head- normocephalic, atraumatic Eyes-  Sclera clear, conjunctiva  pink Ears- hearing intact Oropharynx- clear Lungs- Clear to ausculation bilaterally, normal work of breathing Chest- pacemaker pocket is well healed Heart- Regular rate and rhythm,2/6 SEM LUSB which is late peaking GI- soft, NT, ND, + BS Extremities- no clubbing, cyanosis, or edema  Pacemaker interrogation- reviewed in detail today,  See PACEART report  Assessment and Plan:  1. afib Device interrogation today reveals 9 hours of afib.  CHADS2VASC score is at least 4.  Today, I discussed anticoagulants including coumadin, pradaxa, xarelto, and eliquis today as indicated for risk reduction in stroke and systemic emboli with nonvalvular atrial fibrillation.  Risks, benefits, and alternatives to each of these drugs were discussed at length today.  He would prefer coumadin.  I will therefore start coumadin and enroll in the coumadin clinic.  Given severe PVD, I will keep him on ASA also.  I have turned electrogram storage on today to confirm afib.  Once his afib has been documented, we should turn electrogram storage off to preserve his battery.  2. Complete heart block Normal pacemaker function See Pace Art report Electrograms turned on as above  3. PVD/CAD No changes as he  is clinically stable  4. HTN Stable No change required today  5. Tobacco- cessation strongly advised He is not ready to quit  6. AS (moderate to severe) No symptoms Dr Excell Seltzer is following also  Return to see Nehemiah Settle in the device clinic in 6 months for afib management I will see in a year

## 2012-09-06 ENCOUNTER — Ambulatory Visit (INDEPENDENT_AMBULATORY_CARE_PROVIDER_SITE_OTHER): Payer: Medicare Other | Admitting: *Deleted

## 2012-09-06 DIAGNOSIS — Z7901 Long term (current) use of anticoagulants: Secondary | ICD-10-CM

## 2012-09-06 DIAGNOSIS — I480 Paroxysmal atrial fibrillation: Secondary | ICD-10-CM

## 2012-09-06 DIAGNOSIS — I4891 Unspecified atrial fibrillation: Secondary | ICD-10-CM

## 2012-09-06 LAB — POCT INR: INR: 1.3

## 2012-09-06 NOTE — Progress Notes (Signed)
Patient lives with his ex wife, she is disabled due to severe arthritis according to pt, he states he has a daughter that is a scrub nurse at [employer]  A full discussion of the nature of anticoagulants has been carried out.  A benefit risk analysis has been presented to the patient, so that they understand the justification for choosing anticoagulation at this time. The need for frequent and regular monitoring, precise dosage adjustment and compliance is stressed.  Side effects of potential bleeding are discussed.  The patient should avoid any OTC items containing aspirin or ibuprofen, and should avoid great swings in general diet.  Avoid alcohol consumption.  Call if any signs of abnormal bleeding.

## 2012-09-07 LAB — PACEMAKER DEVICE OBSERVATION
AL IMPEDENCE PM: 384 Ohm
AL THRESHOLD: 0.5 V
ATRIAL PACING PM: 9.1
BAMS-0001: 150 {beats}/min
BAMS-0003: 70 {beats}/min
BATTERY VOLTAGE: 2.79 V
RV LEAD AMPLITUDE: 12 mv

## 2012-09-12 ENCOUNTER — Ambulatory Visit (INDEPENDENT_AMBULATORY_CARE_PROVIDER_SITE_OTHER): Payer: Medicare Other | Admitting: *Deleted

## 2012-09-12 DIAGNOSIS — Z7901 Long term (current) use of anticoagulants: Secondary | ICD-10-CM

## 2012-09-12 DIAGNOSIS — I480 Paroxysmal atrial fibrillation: Secondary | ICD-10-CM

## 2012-09-12 DIAGNOSIS — I4891 Unspecified atrial fibrillation: Secondary | ICD-10-CM

## 2012-09-12 LAB — POCT INR: INR: 2.7

## 2012-09-13 ENCOUNTER — Encounter: Payer: Self-pay | Admitting: Internal Medicine

## 2012-09-19 ENCOUNTER — Ambulatory Visit (INDEPENDENT_AMBULATORY_CARE_PROVIDER_SITE_OTHER): Payer: Medicare Other

## 2012-09-19 DIAGNOSIS — I4891 Unspecified atrial fibrillation: Secondary | ICD-10-CM

## 2012-09-19 DIAGNOSIS — I480 Paroxysmal atrial fibrillation: Secondary | ICD-10-CM

## 2012-09-19 DIAGNOSIS — Z7901 Long term (current) use of anticoagulants: Secondary | ICD-10-CM

## 2012-09-26 ENCOUNTER — Ambulatory Visit (INDEPENDENT_AMBULATORY_CARE_PROVIDER_SITE_OTHER): Payer: Medicare Other | Admitting: *Deleted

## 2012-09-26 DIAGNOSIS — I480 Paroxysmal atrial fibrillation: Secondary | ICD-10-CM

## 2012-09-26 DIAGNOSIS — I4891 Unspecified atrial fibrillation: Secondary | ICD-10-CM

## 2012-09-26 DIAGNOSIS — Z7901 Long term (current) use of anticoagulants: Secondary | ICD-10-CM

## 2012-10-03 ENCOUNTER — Ambulatory Visit (INDEPENDENT_AMBULATORY_CARE_PROVIDER_SITE_OTHER): Payer: Medicare Other | Admitting: *Deleted

## 2012-10-03 DIAGNOSIS — Z7901 Long term (current) use of anticoagulants: Secondary | ICD-10-CM

## 2012-10-03 DIAGNOSIS — I4891 Unspecified atrial fibrillation: Secondary | ICD-10-CM

## 2012-10-03 DIAGNOSIS — I480 Paroxysmal atrial fibrillation: Secondary | ICD-10-CM

## 2012-10-10 ENCOUNTER — Ambulatory Visit (INDEPENDENT_AMBULATORY_CARE_PROVIDER_SITE_OTHER): Payer: Medicare Other | Admitting: *Deleted

## 2012-10-10 DIAGNOSIS — Z7901 Long term (current) use of anticoagulants: Secondary | ICD-10-CM

## 2012-10-10 DIAGNOSIS — I4891 Unspecified atrial fibrillation: Secondary | ICD-10-CM

## 2012-10-10 DIAGNOSIS — I480 Paroxysmal atrial fibrillation: Secondary | ICD-10-CM

## 2012-10-10 LAB — POCT INR: INR: 2.2

## 2012-10-21 ENCOUNTER — Encounter (INDEPENDENT_AMBULATORY_CARE_PROVIDER_SITE_OTHER): Payer: Medicare Other

## 2012-10-21 DIAGNOSIS — I700 Atherosclerosis of aorta: Secondary | ICD-10-CM

## 2012-10-21 DIAGNOSIS — I359 Nonrheumatic aortic valve disorder, unspecified: Secondary | ICD-10-CM

## 2012-10-21 DIAGNOSIS — I714 Abdominal aortic aneurysm, without rupture, unspecified: Secondary | ICD-10-CM

## 2012-10-31 ENCOUNTER — Ambulatory Visit (INDEPENDENT_AMBULATORY_CARE_PROVIDER_SITE_OTHER): Payer: Medicare Other | Admitting: *Deleted

## 2012-10-31 DIAGNOSIS — I480 Paroxysmal atrial fibrillation: Secondary | ICD-10-CM

## 2012-10-31 DIAGNOSIS — I4891 Unspecified atrial fibrillation: Secondary | ICD-10-CM

## 2012-10-31 DIAGNOSIS — Z7901 Long term (current) use of anticoagulants: Secondary | ICD-10-CM

## 2012-10-31 LAB — POCT INR: INR: 2.1

## 2012-11-04 ENCOUNTER — Other Ambulatory Visit (HOSPITAL_COMMUNITY): Payer: Medicare Other

## 2012-11-07 ENCOUNTER — Ambulatory Visit (HOSPITAL_COMMUNITY): Payer: Medicare Other | Attending: Cardiology

## 2012-11-07 DIAGNOSIS — E785 Hyperlipidemia, unspecified: Secondary | ICD-10-CM | POA: Insufficient documentation

## 2012-11-07 DIAGNOSIS — I359 Nonrheumatic aortic valve disorder, unspecified: Secondary | ICD-10-CM

## 2012-11-07 DIAGNOSIS — I2510 Atherosclerotic heart disease of native coronary artery without angina pectoris: Secondary | ICD-10-CM | POA: Insufficient documentation

## 2012-11-07 DIAGNOSIS — I10 Essential (primary) hypertension: Secondary | ICD-10-CM | POA: Insufficient documentation

## 2012-11-07 DIAGNOSIS — F17200 Nicotine dependence, unspecified, uncomplicated: Secondary | ICD-10-CM | POA: Insufficient documentation

## 2012-11-07 DIAGNOSIS — I714 Abdominal aortic aneurysm, without rupture: Secondary | ICD-10-CM

## 2012-11-07 NOTE — Progress Notes (Signed)
Echocardiogram performed.  

## 2012-11-11 ENCOUNTER — Other Ambulatory Visit: Payer: Self-pay | Admitting: Internal Medicine

## 2012-11-28 ENCOUNTER — Ambulatory Visit (INDEPENDENT_AMBULATORY_CARE_PROVIDER_SITE_OTHER): Payer: Medicare Other | Admitting: *Deleted

## 2012-11-28 DIAGNOSIS — I4891 Unspecified atrial fibrillation: Secondary | ICD-10-CM

## 2012-11-28 DIAGNOSIS — Z7901 Long term (current) use of anticoagulants: Secondary | ICD-10-CM

## 2012-11-28 DIAGNOSIS — I480 Paroxysmal atrial fibrillation: Secondary | ICD-10-CM

## 2012-12-02 ENCOUNTER — Ambulatory Visit (INDEPENDENT_AMBULATORY_CARE_PROVIDER_SITE_OTHER): Payer: Medicare Other | Admitting: Cardiovascular Disease

## 2012-12-02 ENCOUNTER — Encounter: Payer: Self-pay | Admitting: Cardiovascular Disease

## 2012-12-02 VITALS — BP 162/93 | HR 64 | Ht 67.0 in | Wt 164.0 lb

## 2012-12-02 DIAGNOSIS — I359 Nonrheumatic aortic valve disorder, unspecified: Secondary | ICD-10-CM

## 2012-12-02 DIAGNOSIS — I4891 Unspecified atrial fibrillation: Secondary | ICD-10-CM

## 2012-12-02 DIAGNOSIS — I10 Essential (primary) hypertension: Secondary | ICD-10-CM

## 2012-12-02 DIAGNOSIS — I2510 Atherosclerotic heart disease of native coronary artery without angina pectoris: Secondary | ICD-10-CM

## 2012-12-02 NOTE — Patient Instructions (Addendum)
Your physician wants you to follow-up in: 6 month You will receive a reminder letter in the mail two months in advance. If you don't receive a letter, please call our office to schedule the follow-up appointment.   Your physician recommends that you continue on your current medications as directed. Please refer to the Current Medication list given to you today.  

## 2012-12-02 NOTE — Progress Notes (Signed)
HPI:  79 year old gentleman presenting for followup evaluation. He is followed for aortic stenosis and peripheral arterial disease.  From a symptomatic perspective he is doing well. He specifically denies chest pain or pressure, dyspnea, lightheadedness, or claudication symptoms. He continues to smoke one half pack daily. He quit for 6 months back in the 1950s but gained over 50 pounds and started smoking again. He has not quit since then.   He lives upstairs from his daughter. His current activities include walking up and down the stairs several times per day and also doing yard work on a regular basis. He has no cardiopulmonary symptoms with that level of exertion.  Outpatient Encounter Prescriptions as of 12/02/2012  Medication Sig Dispense Refill  . amLODipine (NORVASC) 10 MG tablet Take 1 tablet (10 mg total) by mouth daily.  30 tablet  11  . aspirin 81 MG tablet Take 81 mg by mouth daily.        . folic acid (FOLVITE) 1 MG tablet Take 1 mg by mouth daily.      . Ginkgo 60 MG TABS Take by mouth daily.        . Inositol Hexaphosphate Sodium POWD by Does not apply route. TAKE 1 TABLET DAILY IN THE AM      . Misc Natural Products (OSTEO BI-FLEX JOINT SHIELD PO) Take by mouth. TAKE 1 TAB DAILY      . Naproxen Sodium (ALEVE PO) Take by mouth. TAKE 1 TAB DAIY      . simvastatin (ZOCOR) 20 MG tablet TAKE 1 TABLET (20 MG TOTAL) BY MOUTH AT BEDTIME.  30 tablet  5  . warfarin (COUMADIN) 5 MG tablet Take 1 tablet (5 mg total) by mouth daily.  30 tablet  3  . [DISCONTINUED] Bromelains (BROMELAIN PO) Take by mouth daily. Pt unsure if he is taking this      . [DISCONTINUED] CINNAMON PO Take by mouth daily.         No facility-administered encounter medications on file as of 12/02/2012.    Allergies  Allergen Reactions  . Penicillins     REACTION: Severe Hives    Past Medical History  Diagnosis Date  . HTN (hypertension)   . ED (erectile dysfunction)   . CAD (coronary artery disease)   .  Tobacco abuse   . 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 HLD (hyperlipidemia)   . Complete heart block 12/05/07    s/p PPM  . Moderate asthma   . Tobacco abuse     ROS: Negative except as per HPI  BP 162/93  Pulse 64  Ht 5\' 7"  (1.702 m)  Wt 164 lb (74.39 kg)  BMI 25.68 kg/m2  PHYSICAL EXAM: Pt is alert and oriented, pleasant elderly male in NAD HEENT: normal Neck: JVP - normal, carotids 2+= with bilateral bruits Lungs: CTA bilaterally CV: RRR with grade 3/6 harsh crescendo decrescendo murmur best heard at the right upper sternal border. Aortic closure is diminished. Abd: soft, NT, Positive BS, no hepatomegaly Ext: no C/C/E, distal pulses intact and equal Skin: warm/dry no rash  EKG:  Electronic ventricular pacemaker 64 beats per minute.  2-D echocardiogram 11/07/2012: Left ventricle: The cavity size was normal. Wall thickness was increased in a pattern of moderate LVH. There was mild focal basal hypertrophy of the septum. Systolic function was normal. The estimated ejection fraction was in the range of 50% to 55%. Regional wall  motion abnormalities: There is mild hypokinesis of the mid-distalanteroseptal myocardium. Doppler parameters are consistent with abnormal left ventricular relaxation (grade 1 diastolic dysfunction).  ------------------------------------------------------------ Aortic valve: Trileaflet. Doppler: There was moderate stenosis. Mild regurgitation. VTI ratio of LVOT to aortic valve: 0.2. Valve area: 0.62cm^2(VTI). Indexed valve area: 0.33cm^2/m^2 (VTI). Peak velocity ratio of LVOT to aortic valve: 0.22. Valve area: 0.68cm^2 (Vmax). Indexed valve area: 0.36cm^2/m^2 (Vmax). Mean gradient: 36mm Hg (S). Peak gradient: 66mm Hg (S).  ------------------------------------------------------------ Aorta: The aorta was normal, not dilated, and  non-diseased.  ------------------------------------------------------------ Mitral valve: Structurally normal valve. Leaflet separation was normal. Doppler: Transvalvular velocity was within the normal range. There was no evidence for stenosis. Mild regurgitation. Peak gradient: 2mm Hg (D).  ------------------------------------------------------------ Left atrium: The atrium was normal in size.  ------------------------------------------------------------ Atrial septum: No defect or patent foramen ovale was identified.  ------------------------------------------------------------ Right ventricle: The cavity size was normal. Wall thickness was normal. Systolic function was normal.  ------------------------------------------------------------ Pulmonic valve: Structurally normal valve. Cusp separation was normal. Doppler: Transvalvular velocity was within the normal range. Trivial regurgitation.  ------------------------------------------------------------ Tricuspid valve: Structurally normal valve. Leaflet separation was normal. Doppler: Transvalvular velocity was within the normal range. Mild regurgitation.  ------------------------------------------------------------ Pulmonary artery: The main pulmonary artery was normal-sized.  ------------------------------------------------------------ Right atrium: The atrium was normal in size.  ------------------------------------------------------------ Pericardium: The pericardium was normal in appearance.  ------------------------------------------------------------ Systemic veins: Inferior vena cava: The vessel was normal in size; the respirophasic diameter changes were in the normal range (= 50%); findings are consistent with normal central venous pressure.  ------------------------------------------------------------ Post procedure conclusions Ascending Aorta:  - The aorta was normal, not dilated, and  non-diseased.  ------------------------------------------------------------  2D measurements Normal Doppler measurements Norma Left ventricle l LVID ED, 37 mm 43-52 Main pulmonary artery chord, Pressure, 29 mm Hg =30 PLAX S LVID ES, 27.1 mm 23-38 Left ventricle chord, Ea, lat 5.7 cm/s ----- PLAX ann, tiss 5 FS, chord, 27 % >29 DP PLAX E/Ea, lat 13. ----- LVPW, ED 15.2 mm ------ ann, tiss 65 IVS/LVPW 1 <1.3 DP ratio, ED Ea, med 4.0 cm/s ----- Ventricular septum ann, tiss 9 IVS, ED 15.2 mm ------ DP LVOT E/Ea, med 19. ----- Diam, S 20 mm ------ ann, tiss 19 Area 3.14 cm^2 ------ DP Diam 20 mm ------ LVOT Aorta Peak vel, 87. cm/s ----- Root diam, 32 mm ------ S 2 ED VTI, S 20. cm ----- Left atrium 1 AP dim 34 mm ------ HR 64 bpm ----- AP dim 1.82 cm/m^2 <2.2 Stroke vol 63. ml ----- index 1 Cardiac 4 L/min ----- output Cardiac 2.2 L/(min-m ----- index ^2) Stroke 33. ml/m^2 ----- index 8 Aortic valve Peak vel, 405 cm/s ----- S Mean vel, 270 cm/s ----- S VTI, S 102 cm ----- Mean 36 mm Hg ----- gradient, S Peak 66 mm Hg ----- gradient, S VTI ratio 0.2 ----- LVOT/AV Area, VTI 0.6 cm^2 ----- 2 Area index 0.3 cm^2/m^2 ----- (VTI) 3 Peak vel 0.2 ----- ratio, 2 LVOT/AV Area, Vmax 0.6 cm^2 ----- 8 Area index 0.3 cm^2/m^2 ----- (Vmax) 6 Regurg PHT 519 ms ----- Mitral valve Peak E vel 78. cm/s ----- 5 Peak A vel 126 cm/s ----- Decelerati 201 ms 150-2 on time 30 Peak 2 mm Hg ----- gradient, D Peak E/A 0.6 ----- ratio Tricuspid valve Regurg 247 cm/s ----- peak vel Peak RV-RA 24 mm Hg ----- gradient, S Systemic veins Estimated 5 mm Hg ----- CVP Right ventricle Pressure, 29 mm Hg <30 S Sa vel, 10. cm/s ----- lat  ann, 5 tiss DP  ASSESSMENT AND PLAN: 1. Aortic stenosis, moderate to severe. I reviewed the patient's recent echocardiographic images. Based on his peak transaortic velocity, and LVOT: Aortic valve velocity ratio, I suspect he does have  severe aortic stenosis. However, he remains remarkably asymptomatic. On going clinical followup and medical therapy are recommended. I will see him back in 6 months. He was counseled about signs and symptoms to watch for.  2. Abdominal aortic aneurysm. His recent duplex was reviewed and his abdominal aortic aneurysm is small, measuring 2.8 x 3.3 cm. He will followup in one year with a repeat duplex scan.  3. Lower extremity peripheral arterial disease. He has no symptoms of intermittent claudication. He does have hip and knee pains that sound) related to osteoarthritis. He will continue on aspirin.  4. Tobacco abuse. We discussed cessation. He is not interested at this point.  5. Paroxysmal atrial fibrillation. His most recent pacemaker evaluation from July 2014 was reviewed. He had 51 mode switch episodes, the longest of which was 9 hours in duration. He is asymptomatic. He continues on warfarin for anticoagulation. He's followed by Dr Johney Frame.   Tonny Bollman 12/02/2012 11:39 AM

## 2012-12-23 ENCOUNTER — Ambulatory Visit (INDEPENDENT_AMBULATORY_CARE_PROVIDER_SITE_OTHER): Payer: Medicare Other | Admitting: *Deleted

## 2012-12-23 DIAGNOSIS — Z7901 Long term (current) use of anticoagulants: Secondary | ICD-10-CM

## 2012-12-23 DIAGNOSIS — I4891 Unspecified atrial fibrillation: Secondary | ICD-10-CM

## 2012-12-23 DIAGNOSIS — I480 Paroxysmal atrial fibrillation: Secondary | ICD-10-CM

## 2012-12-23 LAB — POCT INR: INR: 2.5

## 2013-01-20 ENCOUNTER — Ambulatory Visit (INDEPENDENT_AMBULATORY_CARE_PROVIDER_SITE_OTHER): Payer: Medicare Other | Admitting: Pharmacist

## 2013-01-20 ENCOUNTER — Encounter (INDEPENDENT_AMBULATORY_CARE_PROVIDER_SITE_OTHER): Payer: Self-pay

## 2013-01-20 DIAGNOSIS — I4891 Unspecified atrial fibrillation: Secondary | ICD-10-CM

## 2013-01-20 DIAGNOSIS — I480 Paroxysmal atrial fibrillation: Secondary | ICD-10-CM

## 2013-01-20 DIAGNOSIS — Z7901 Long term (current) use of anticoagulants: Secondary | ICD-10-CM

## 2013-01-27 DEATH — deceased

## 2013-02-11 ENCOUNTER — Ambulatory Visit (INDEPENDENT_AMBULATORY_CARE_PROVIDER_SITE_OTHER): Payer: Medicare Other | Admitting: Cardiology

## 2013-02-11 DIAGNOSIS — Z7901 Long term (current) use of anticoagulants: Secondary | ICD-10-CM

## 2013-02-11 DIAGNOSIS — I4891 Unspecified atrial fibrillation: Secondary | ICD-10-CM

## 2013-02-11 DIAGNOSIS — I480 Paroxysmal atrial fibrillation: Secondary | ICD-10-CM

## 2013-02-24 ENCOUNTER — Other Ambulatory Visit: Payer: Self-pay | Admitting: Cardiovascular Disease

## 2013-02-28 ENCOUNTER — Other Ambulatory Visit: Payer: Self-pay | Admitting: *Deleted

## 2013-02-28 DIAGNOSIS — I4891 Unspecified atrial fibrillation: Secondary | ICD-10-CM

## 2013-02-28 MED ORDER — WARFARIN SODIUM 5 MG PO TABS: ORAL_TABLET | ORAL | Status: DC

## 2013-03-11 ENCOUNTER — Encounter: Payer: Self-pay | Admitting: *Deleted

## 2013-03-26 ENCOUNTER — Encounter: Payer: Medicare Other | Admitting: Cardiology

## 2013-04-08 ENCOUNTER — Encounter: Payer: Medicare Other | Admitting: Cardiology

## 2013-04-15 ENCOUNTER — Encounter: Payer: Medicare Other | Admitting: Cardiology

## 2013-04-29 ENCOUNTER — Encounter: Payer: Medicare Other | Admitting: Cardiology

## 2013-05-20 ENCOUNTER — Ambulatory Visit (INDEPENDENT_AMBULATORY_CARE_PROVIDER_SITE_OTHER): Payer: Medicare Other | Admitting: Cardiology

## 2013-05-20 VITALS — BP 129/68 | HR 64

## 2013-05-20 DIAGNOSIS — I4891 Unspecified atrial fibrillation: Secondary | ICD-10-CM

## 2013-05-20 DIAGNOSIS — I442 Atrioventricular block, complete: Secondary | ICD-10-CM

## 2013-05-20 DIAGNOSIS — I480 Paroxysmal atrial fibrillation: Secondary | ICD-10-CM

## 2013-05-20 DIAGNOSIS — Z950 Presence of cardiac pacemaker: Secondary | ICD-10-CM

## 2013-05-20 LAB — MDC_IDC_ENUM_SESS_TYPE_INCLINIC
Brady Statistic RA Percent Paced: 7.8 %
Brady Statistic RV Percent Paced: 98 %
Date Time Interrogation Session: 20150324161924
Implantable Pulse Generator Model: 5826
Implantable Pulse Generator Serial Number: 2205492
Lead Channel Impedance Value: 379 Ohm
Lead Channel Impedance Value: 431 Ohm
Lead Channel Pacing Threshold Amplitude: 0.5 V
Lead Channel Pacing Threshold Pulse Width: 0.4 ms
Lead Channel Pacing Threshold Pulse Width: 0.4 ms
MDC IDC MSMT BATTERY IMPEDANCE: 1000 Ohm
MDC IDC MSMT BATTERY VOLTAGE: 2.75 V
MDC IDC MSMT LEADCHNL RA SENSING INTR AMPL: 1.5 mV
MDC IDC MSMT LEADCHNL RV PACING THRESHOLD AMPLITUDE: 0.75 V
MDC IDC SET LEADCHNL RA PACING AMPLITUDE: 2 V
MDC IDC SET LEADCHNL RV PACING PULSEWIDTH: 0.4 ms
MDC IDC SET LEADCHNL RV SENSING SENSITIVITY: 2 mV

## 2013-05-20 NOTE — Progress Notes (Signed)
ELECTROPHYSIOLOGY OFFICE NOTE   Patient ID: John NearingDonald E Marohl MRN: [id number], DOB/AGE: Aug 02, 1930   Date of Visit: 05/20/2013  Primary Physician: Marjory LiesBrent Burnett, MD Primary Cardiologist: Excell Seltzerooper, MD Primary EP: Johney FrameAllred, MD Reason for Visit: EP/device follow-up  History of Present Illness  John Farmer is a 80 y.o. male with complete heart block s/p PPM implant, PAF, CAD, mod-severe AS and PVD who presents today for routine electrophysiology followup. Since last being seen in our clinic, he reports he is doing well and has no complaints. He has been in his usual state of health and continues to be fairly active without limitation / difficulty. He denies chest pain or shortness of breath. He denies palpitations, dizziness, near syncope or syncope. He denies LE swelling, orthopnea or PND. He is compliant with medications; however, he continues to smoke.  Past Medical History Past Medical History  Diagnosis Date  . HTN (hypertension)   . ED (erectile dysfunction)   . CAD (coronary artery disease)   . Tobacco abuse   . 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 HLD (hyperlipidemia)   . Complete heart block 12/05/07    s/p PPM  . Moderate asthma   . Tobacco abuse     Past Surgical History Past Surgical History  Procedure Laterality Date  . Lumbar hnp    . Tonsillectomy    . Pacemaker insertion  12/05/07    by Fawn KirkJA    Allergies/Intolerances Allergies  Allergen Reactions  . Penicillins     REACTION: Severe Hives    Current Home Medications Current Outpatient Prescriptions  Medication Sig Dispense Refill  . amLODipine (NORVASC) 10 MG tablet TAKE 1 TABLET (10 MG     TOTAL) BY MOUTH DAILY.  30 tablet  6  . aspirin 81 MG tablet Take 81 mg by mouth daily.        . folic acid (FOLVITE) 1 MG tablet Take 1 mg by mouth daily.      . Ginkgo 60 MG TABS Take by mouth daily.        . Misc Natural Products (OSTEO BI-FLEX  JOINT SHIELD PO) Take by mouth. TAKE 1 TAB DAILY      . Naproxen Sodium (ALEVE PO) Take by mouth. TAKE 1 TAB DAIY      . simvastatin (ZOCOR) 20 MG tablet TAKE 1 TABLET (20 MG TOTAL) BY MOUTH AT BEDTIME.  30 tablet  5  . warfarin (COUMADIN) 5 MG tablet Take as directed by coumadin clinic  30 tablet  3   No current facility-administered medications for this visit.    Social History History   Social History  . Marital Status: Single    Spouse Name: N/A    Number of Children: N/A  . Years of Education: N/A   Occupational History  . Not on file.   Social History Main Topics  . Smoking status: Smoker, Current Status Unknown  . Smokeless tobacco: Not on file     Comment: Smoked 1 ppd for more than 60 years, very clear that he will not quit.  . Alcohol Use: Yes     Comment: drinks 2 scotch drinks per day  . Drug Use: Not on file  . Sexual Activity: Not on file   Other Topics Concern  . Not on file   Social History Narrative   [place] 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      Review of  Systems General: No chills, fever, night sweats or weight changes Cardiovascular: No chest pain, dyspnea on exertion, edema, orthopnea, palpitations, paroxysmal nocturnal dyspnea Dermatological: No rash, lesions or masses Respiratory: No cough, dyspnea Urologic: No hematuria, dysuria Abdominal: No nausea, vomiting, diarrhea, bright red blood per rectum, melena, or hematemesis Neurologic: No visual changes, weakness, changes in mental status All other systems reviewed and are otherwise negative except as noted above.  Physical Exam Vitals: Blood pressure 129/68, pulse 64.  General: Well developed, well appearing 80 y.o. male in no acute distress. HEENT: Normocephalic, atraumatic. EOMs intact. Sclera nonicteric. Oropharynx clear.  Neck: Supple. No JVD. Lungs: Respirations regular and unlabored, CTA bilaterally. No wheezes, rales or rhonchi. Heart: RRR. S1, S2 present. No murmurs,  rub, S3 or S4. Abdomen: Soft, non-distended.  Extremities: No clubbing, cyanosis or edema.PT/Radials 2+ and equal bilaterally. Psych: Normal affect. Neuro: Alert and oriented X 3. Moves all extremities spontaneously.   Diagnostics Device interrogation today - Normal device function. Thresholds, sensing, impedances consistent with previous measurements. Device programmed to maximize longevity. Mode switch <1%. 30 AT/AF episodes, longest 40 minutes. Device programmed at appropriate safety margins. Histogram distribution appropriate for patient activity level. V rates during AMS histogram show V rate is below 85 bpm >95% of the time.  Device programmed to optimize intrinsic conduction. Estimated longevity 3.25 - 4.5 years.   Assessment and Plan  1. Complete heart block s/p PPM implant Normal device function No programming changes made Return for follow-up with Dr. Johney Frame in 6 months  2. Paroxysmal atrial fibrillation Asymptomatic Rate controlled - V rate during AMS histogram shows V rate <85 bpm >95% of the time    Continue warfarin for stroke prevention  Signed, Junie Avilla, PA-C 05/20/2013, 5:51 PM

## 2013-05-20 NOTE — Patient Instructions (Signed)
Your physician wants you to follow-up in: 6 months with dr Johney Frameallred Bonita QuinYou will receive a reminder letter in the mail two months in advance. If you don't receive a letter, please call our office to schedule the follow-up appointment.

## 2013-05-25 ENCOUNTER — Encounter: Payer: Self-pay | Admitting: Cardiology

## 2013-06-26 ENCOUNTER — Telehealth: Payer: Self-pay | Admitting: *Deleted

## 2013-06-26 NOTE — Telephone Encounter (Signed)
Pt aware of appt.

## 2013-06-26 NOTE — Telephone Encounter (Signed)
Contact number in system has a busy signal, will keep trying the number and will find another way to contact patient/pt daughter to make them aware of appt

## 2013-06-26 NOTE — Telephone Encounter (Signed)
Message copied by Kem ParkinsonBARNES, Ethelmae Ringel on Thu Jun 26, 2013  8:17 AM ------      Message from: Minda MeoEDMISTEN, BROOKE O      Created: Wed Jun 25, 2013  7:03 PM       Hi Cadynce Garrette,            Please call [name] and tell him that Dr. Johney FrameAllred wants to make a small adjustment to his pacemaker that will save battery life. I have scheduled an appointment in the device clinic on Friday May 1 at 10:00 AM. Please ask him to come in so that we can make this small change. It will not change the way his pacemaker functions. It will help save battery though.            Please let me know if you or [name] have any questions.      Thank you, thank you!!!            Brooke ------

## 2013-06-27 ENCOUNTER — Ambulatory Visit (INDEPENDENT_AMBULATORY_CARE_PROVIDER_SITE_OTHER): Payer: Medicare Other | Admitting: *Deleted

## 2013-06-27 DIAGNOSIS — I442 Atrioventricular block, complete: Secondary | ICD-10-CM

## 2013-06-27 LAB — MDC_IDC_ENUM_SESS_TYPE_INCLINIC
Battery Impedance: 1100 Ohm
Battery Voltage: 2.76 V
Date Time Interrogation Session: 20150501102545
Implantable Pulse Generator Model: 5826
Lead Channel Impedance Value: 426 Ohm
Lead Channel Setting Sensing Sensitivity: 2 mV
MDC IDC MSMT LEADCHNL RA IMPEDANCE VALUE: 373 Ohm
MDC IDC PG SERIAL: 2205492
MDC IDC SET LEADCHNL RA PACING AMPLITUDE: 2 V
MDC IDC SET LEADCHNL RV PACING PULSEWIDTH: 0.4 ms

## 2013-07-04 ENCOUNTER — Encounter: Payer: Self-pay | Admitting: Internal Medicine

## 2013-07-18 ENCOUNTER — Encounter: Payer: Self-pay | Admitting: Internal Medicine

## 2013-10-23 ENCOUNTER — Other Ambulatory Visit (HOSPITAL_COMMUNITY): Payer: Self-pay | Admitting: Cardiology

## 2013-10-23 DIAGNOSIS — I714 Abdominal aortic aneurysm, without rupture, unspecified: Secondary | ICD-10-CM

## 2013-10-28 ENCOUNTER — Encounter (HOSPITAL_COMMUNITY): Payer: Medicare Other

## 2013-11-17 ENCOUNTER — Encounter: Payer: Self-pay | Admitting: Internal Medicine

## 2013-11-17 ENCOUNTER — Ambulatory Visit (HOSPITAL_COMMUNITY): Payer: Medicare Other | Attending: Cardiovascular Disease | Admitting: Cardiology

## 2013-11-17 ENCOUNTER — Ambulatory Visit (INDEPENDENT_AMBULATORY_CARE_PROVIDER_SITE_OTHER): Payer: Medicare Other | Admitting: Internal Medicine

## 2013-11-17 VITALS — BP 132/78 | HR 71 | Ht 67.0 in | Wt 160.0 lb

## 2013-11-17 DIAGNOSIS — F17200 Nicotine dependence, unspecified, uncomplicated: Secondary | ICD-10-CM

## 2013-11-17 DIAGNOSIS — I10 Essential (primary) hypertension: Secondary | ICD-10-CM | POA: Diagnosis not present

## 2013-11-17 DIAGNOSIS — I700 Atherosclerosis of aorta: Secondary | ICD-10-CM

## 2013-11-17 DIAGNOSIS — E785 Hyperlipidemia, unspecified: Secondary | ICD-10-CM | POA: Insufficient documentation

## 2013-11-17 DIAGNOSIS — I739 Peripheral vascular disease, unspecified: Secondary | ICD-10-CM | POA: Insufficient documentation

## 2013-11-17 DIAGNOSIS — I714 Abdominal aortic aneurysm, without rupture, unspecified: Secondary | ICD-10-CM | POA: Diagnosis not present

## 2013-11-17 DIAGNOSIS — I480 Paroxysmal atrial fibrillation: Secondary | ICD-10-CM

## 2013-11-17 DIAGNOSIS — I2510 Atherosclerotic heart disease of native coronary artery without angina pectoris: Secondary | ICD-10-CM | POA: Insufficient documentation

## 2013-11-17 DIAGNOSIS — I4891 Unspecified atrial fibrillation: Secondary | ICD-10-CM

## 2013-11-17 DIAGNOSIS — L98499 Non-pressure chronic ulcer of skin of other sites with unspecified severity: Secondary | ICD-10-CM

## 2013-11-17 DIAGNOSIS — I442 Atrioventricular block, complete: Secondary | ICD-10-CM

## 2013-11-17 LAB — MDC_IDC_ENUM_SESS_TYPE_INCLINIC
Battery Voltage: 2.75 V
Brady Statistic RA Percent Paced: 12 %
Brady Statistic RV Percent Paced: 98 %
Implantable Pulse Generator Serial Number: 2205492
Lead Channel Impedance Value: 392 Ohm
Lead Channel Pacing Threshold Pulse Width: 0.4 ms
Lead Channel Pacing Threshold Pulse Width: 0.4 ms
Lead Channel Sensing Intrinsic Amplitude: 1.4 mV
Lead Channel Setting Pacing Amplitude: 2 V
Lead Channel Setting Pacing Pulse Width: 0.4 ms
Lead Channel Setting Sensing Sensitivity: 2 mV
MDC IDC MSMT BATTERY IMPEDANCE: 1100 Ohm
MDC IDC MSMT LEADCHNL RA PACING THRESHOLD AMPLITUDE: 0.375 V
MDC IDC MSMT LEADCHNL RV IMPEDANCE VALUE: 437 Ohm
MDC IDC MSMT LEADCHNL RV PACING THRESHOLD AMPLITUDE: 1 V
MDC IDC SESS DTM: 20150921103857

## 2013-11-17 MED ORDER — SIMVASTATIN 20 MG PO TABS: 20.0000 mg | ORAL_TABLET | Freq: Every day | ORAL | Status: DC

## 2013-11-17 MED ORDER — AMLODIPINE BESYLATE 10 MG PO TABS: 10.0000 mg | ORAL_TABLET | Freq: Every day | ORAL | Status: DC

## 2013-11-17 NOTE — Patient Instructions (Addendum)
Your physician recommends that you schedule a follow-up appointment in: 6 months with the Device Clinic and Dr. Excell Seltzer and in 12 months with Dr.Allred

## 2013-11-17 NOTE — Progress Notes (Signed)
Aorta duplex performed. 

## 2013-11-17 NOTE — Progress Notes (Signed)
PCP: Delorse Lek, MD  John Farmer is a 80 y.o. male who presents today for routine electrophysiology followup.  Since last being seen in our clinic, the patient reports doing very well.   He continues to smoke.  He continues to smoke and is not willing to quit.  Today, he denies symptoms of palpitations, chest pain, shortness of breath,  lower extremity edema, dizziness, presyncope, or syncope.  The patient is otherwise without complaint today.   Past Medical History  Diagnosis Date  . HTN (hypertension)   . ED (erectile dysfunction)   . CAD (coronary artery disease)   . Tobacco abuse   . 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 HLD (hyperlipidemia)   . Complete heart block 12/05/07    s/p PPM  . Moderate asthma   . Tobacco abuse    Past Surgical History  Procedure Laterality Date  . Lumbar hnp    . Tonsillectomy    . Pacemaker insertion  12/05/07    by Fawn Kirk    Current Outpatient Prescriptions  Medication Sig Dispense Refill  . amLODipine (NORVASC) 10 MG tablet TAKE 1 TABLET (10 MG     TOTAL) BY MOUTH DAILY.  30 tablet  6  . aspirin 81 MG tablet Take 81 mg by mouth daily.        . folic acid (FOLVITE) 1 MG tablet Take 1 mg by mouth daily.      . Ginkgo 60 MG TABS Take 1 tablet by mouth daily.       . Misc Natural Products (OSTEO BI-FLEX JOINT SHIELD PO) Take by mouth. TAKE 1 TAB DAILY      . Naproxen Sodium (ALEVE PO) Take by mouth. TAKE 1 TAB DAIY      . simvastatin (ZOCOR) 20 MG tablet TAKE 1 TABLET (20 MG TOTAL) BY MOUTH AT BEDTIME.  30 tablet  5  . warfarin (COUMADIN) 5 MG tablet Take as directed by coumadin clinic  30 tablet  3   No current facility-administered medications for this visit.    Physical Exam: Filed Vitals:   11/17/13 1011  BP: 132/78  Pulse: 71  Height:  (1.702 m)  Weight: 160 lb (72.576 kg)    GEN- The patient is well appearing, alert and oriented x 3 today.   Head-  normocephalic, atraumatic Eyes-  Sclera clear, conjunctiva pink Ears- hearing intact Oropharynx- clear Lungs- Clear to ausculation bilaterally, normal work of breathing Chest- pacemaker pocket is well healed Heart- Regular rate and rhythm,2/6 SEM LUSB which is late peaking GI- soft, NT, ND, + BS Extremities- no clubbing, cyanosis, or edema  Pacemaker interrogation- reviewed in detail today,  See PACEART report  Assessment and Plan:  1. afib asymptomatic Anticoagulated with coumadin  2. Complete heart block Normal pacemaker function See Pace Art report No changes today  3. PVD/CAD No changes as he is clinically stable  4. HTN Stable No change required today  5. Tobacco- cessation strongly advised He is not ready to quit  6. AS (moderate to severe) No symptoms Dr Excell Seltzer is following also  Return to see Dr Excell Seltzer in 6 months.  Device clinic check at that time Return to see me or my NP in 1 year

## 2014-05-18 ENCOUNTER — Encounter: Payer: Self-pay | Admitting: Cardiovascular Disease

## 2014-05-18 ENCOUNTER — Ambulatory Visit (INDEPENDENT_AMBULATORY_CARE_PROVIDER_SITE_OTHER): Payer: Medicare Other | Admitting: *Deleted

## 2014-05-18 ENCOUNTER — Ambulatory Visit (INDEPENDENT_AMBULATORY_CARE_PROVIDER_SITE_OTHER): Payer: Medicare Other | Admitting: Cardiovascular Disease

## 2014-05-18 VITALS — BP 156/82 | HR 72 | Ht 67.0 in | Wt 166.1 lb

## 2014-05-18 DIAGNOSIS — I442 Atrioventricular block, complete: Secondary | ICD-10-CM

## 2014-05-18 DIAGNOSIS — I2510 Atherosclerotic heart disease of native coronary artery without angina pectoris: Secondary | ICD-10-CM

## 2014-05-18 DIAGNOSIS — I350 Nonrheumatic aortic (valve) stenosis: Secondary | ICD-10-CM

## 2014-05-18 LAB — MDC_IDC_ENUM_SESS_TYPE_INCLINIC
Battery Impedance: 1400 Ohm
Battery Voltage: 2.75 V
Brady Statistic RV Percent Paced: 99 %
Date Time Interrogation Session: 20160321151638
Implantable Pulse Generator Serial Number: 2205492
Lead Channel Impedance Value: 386 Ohm
Lead Channel Pacing Threshold Amplitude: 0.375 V
Lead Channel Pacing Threshold Amplitude: 0.875 V
Lead Channel Sensing Intrinsic Amplitude: 1.6 mV
Lead Channel Setting Pacing Amplitude: 2 V
Lead Channel Setting Pacing Pulse Width: 0.4 ms
Lead Channel Setting Sensing Sensitivity: 2 mV
MDC IDC MSMT LEADCHNL RA PACING THRESHOLD PULSEWIDTH: 0.4 ms
MDC IDC MSMT LEADCHNL RV IMPEDANCE VALUE: 447 Ohm
MDC IDC MSMT LEADCHNL RV PACING THRESHOLD PULSEWIDTH: 0.4 ms
MDC IDC MSMT LEADCHNL RV SENSING INTR AMPL: 12 mV
MDC IDC STAT BRADY RA PERCENT PACED: 9.5 %

## 2014-05-18 NOTE — Patient Instructions (Signed)
Your physician has requested that you have an echocardiogram. Echocardiography is a painless test that uses sound waves to create images of your heart. It provides your doctor with information about the size and shape of your heart and how well your heart's chambers and valves are working. This procedure takes approximately one hour. There are no restrictions for this procedure.  Your physician recommends that you continue on your current medications as directed. Please refer to the Current Medication list given to you today.  Your physician wants you to follow-up in: 6 MONTHS with Dr Cooper.  You will receive a reminder letter in the mail two months in advance. If you don't receive a letter, please call our office to schedule the follow-up appointment.  

## 2014-05-18 NOTE — Progress Notes (Signed)
Cardiology Office Note   Date:  05/18/2014   ID:  John Farmer, DOB [birth year], MRN [id number]  PCP:  Delorse LekBURNETT,BRENT A, MD  Cardiologist:  Tonny BollmanMichael Cosme Jacob, MD    Chief Complaint  Patient presents with  . aortic valve disorder     History of Present Illness: John NearingDonald E Farmer is a 81 y.o. male who presents for follow-up of aortic stenosis. He was last seen in October 2014.   The patient feels remarkably well. He denies chest pain, shortness of breath, edema, claudication symptoms, lightheadedness, or syncope. He remains active, continues to work, drive, and live independently. He has no complaints today and reports good compliance with his medications. He continues to smoke cigarettes without interest in quitting.    Past Medical History  Diagnosis Date  . HTN (hypertension)   . ED (erectile dysfunction)   . CAD (coronary artery disease)   . Tobacco abuse   . 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 HLD (hyperlipidemia)   . Complete heart block 12/05/07    s/p PPM  . Moderate asthma   . Tobacco abuse     Past Surgical History  Procedure Laterality Date  . Lumbar hnp    . Tonsillectomy    . Pacemaker insertion  12/05/07    by Fawn KirkJA    Current Outpatient Prescriptions  Medication Sig Dispense Refill  . amLODipine (NORVASC) 10 MG tablet Take 1 tablet (10 mg total) by mouth daily. 90 tablet 3  . aspirin 81 MG tablet Take 81 mg by mouth daily.      . folic acid (FOLVITE) 1 MG tablet Take 1 mg by mouth daily.    . Glucosamine-Chondroitin (OSTEO BI-FLEX REGULAR STRENGTH PO) Take by mouth daily.    . Misc Natural Products (OSTEO BI-FLEX JOINT SHIELD PO) Take by mouth. TAKE 1 TAB DAILY    . Naproxen Sodium (ALEVE PO) Take by mouth. TAKE 1 TAB DAIY    . simvastatin (ZOCOR) 20 MG tablet Take 1 tablet (20 mg total) by mouth daily at 6 PM. 90 tablet 3  . warfarin (COUMADIN) 5 MG tablet Take as directed by  coumadin clinic 30 tablet 3   No current facility-administered medications for this visit.    Allergies:   Penicillins   Social History:  The patient  reports that he has been smoking.  He does not have any smokeless tobacco history on file. He reports that he drinks alcohol.   Family History:  The patient's  family history includes Coronary artery disease in his other; Skin cancer in his other; Stroke in his other; Thyroid disease in his other.    ROS:  Please see the history of present illness. All other systems are reviewed and negative.    PHYSICAL EXAM: VS:  BP 156/82 mmHg  Pulse 72  Ht 5\' 7"  (1.702 m)  Wt 166 lb 2 oz (75.354 kg)  BMI 26.01 kg/m2 , BMI Body mass index is 26.01 kg/(m^2). GEN: Well nourished, well developed, in no acute distress HEENT: normal Neck: no JVD, no masses. Bilateral bruits Cardiac: RRR with 3/6 harsh systolic murmur at the RUSB, absent A2, late-peaking murmur            Respiratory:  clear to auscultation bilaterally, normal work of breathing GI: soft, nontender, nondistended, + BS MS: no deformity or atrophy Ext: no pretibial edema Skin: warm and dry, no rash Neuro:  Strength and sensation  are intact Psych: euthymic mood, full affect  EKG:  EKG is ordered today. The ekg ordered today shows atrial sensed ventricular-paced rhythm 72 bpm  Recent Labs: No results found for requested labs within last 365 days.   Lipid Panel     Component Value Date/Time   CHOL 204* 08/16/2009 0000   TRIG 84.0 08/16/2009 0000   HDL 39.00* 08/16/2009 0000   CHOLHDL 5 08/16/2009 0000   VLDL 16.8 08/16/2009 0000   LDLCALC 88 11/27/2008 0000   LDLDIRECT 160.1 08/16/2009 0000      Wt Readings from Last 3 Encounters:  05/18/14 166 lb 2 oz (75.354 kg)  11/17/13 160 lb (72.576 kg)  12/02/12 164 lb (74.39 kg)     Cardiac Studies Reviewed: 2D Echo September 2014: Study Conclusions  - Left ventricle: The cavity size was normal. Wall thickness was  increased in a pattern of moderate LVH. There was mild focal basal hypertrophy of the septum. Systolic function was normal. The estimated ejection fraction was in the range of 50% to 55%. There is mild hypokinesis of the mid-distalanteroseptal myocardium. Doppler parameters are consistent with abnormal left ventricular relaxation (grade 1 diastolic dysfunction). - Aortic valve: There was moderate stenosis. Mild regurgitation. - Mitral valve: Mild regurgitation. - Atrial septum: No defect or patent foramen ovale was identified.  ASSESSMENT AND PLAN: 1.  Aortic stenosis: suspect severe based on exam characteristics. Last echo 2014 showed mean gradient 36 mmHg. Pt appears to be asymptomatic. Will repeat echo and consider intervention if high-risk features present. Otherwise anticipate observation in setting asymptomatic clinical status.   2. PAD: no claudication sx's. Tobacco cessation counseling done. Pt not interested in quitting.  3. Tobacco abuse: as above  4. PAF: anticoagulated with warfarin. S/P PPM, followed by Dr Johney Frame.  5. AAA: most recent duplex reviewed with max diameter 2.8x3.2 cm. Iliac stenosis noted as well. Continue annual surveillance.   Current medicines are reviewed with the patient today.  The patient does not have concerns regarding medicines.  The following changes have been made:  no change  Labs/ tests ordered today include:   Orders Placed This Encounter  Procedures  . EKG 12-Lead  . 2D Echocardiogram without contrast    Disposition:   FU 6 months, pending echo results  Signed, Tonny Bollman, MD  05/18/2014 11:58 PM    [facility] [address] [place], [place], [place]  [address] Phone: [phone]; Fax: [phone]

## 2014-05-18 NOTE — Progress Notes (Signed)
Pacemaker check in clinic. Normal device function. Thresholds, sensing, impedances consistent with previous measurements. Device programmed to maximize longevity. 136 mode switches--longest was 3 minutes 20 seconds. + Warfarin. No high ventricular rates noted. Device programmed at appropriate safety margins. Histogram distribution appropriate for patient activity level. Device programmed to optimize intrinsic conduction. Estimated longevity 5 to 7 years. ROV in September with JA.

## 2014-05-20 ENCOUNTER — Ambulatory Visit (HOSPITAL_COMMUNITY): Payer: Medicare Other | Attending: Cardiology | Admitting: Radiology

## 2014-05-20 DIAGNOSIS — I442 Atrioventricular block, complete: Secondary | ICD-10-CM | POA: Insufficient documentation

## 2014-05-20 DIAGNOSIS — I350 Nonrheumatic aortic (valve) stenosis: Secondary | ICD-10-CM | POA: Insufficient documentation

## 2014-05-20 DIAGNOSIS — I2510 Atherosclerotic heart disease of native coronary artery without angina pectoris: Secondary | ICD-10-CM | POA: Insufficient documentation

## 2014-05-20 NOTE — Progress Notes (Signed)
Echocardiogram performed.  

## 2014-06-02 ENCOUNTER — Encounter: Payer: Self-pay | Admitting: Internal Medicine

## 2014-06-16 ENCOUNTER — Telehealth: Payer: Self-pay | Admitting: Cardiovascular Disease

## 2014-06-16 NOTE — Telephone Encounter (Signed)
New Message        Pt calling stating that he received a letter stating that he needed to contact our office to get his echo results. Please call back and advise.

## 2014-06-16 NOTE — Telephone Encounter (Signed)
I spoke with the pt by phone and made him aware of echo results.  The pt verbalized understanding to contact our office if he develops chest discomfort, SOB or lightheadedness.

## 2014-10-28 ENCOUNTER — Encounter (HOSPITAL_COMMUNITY): Payer: Self-pay | Admitting: Emergency Medicine

## 2014-10-28 ENCOUNTER — Emergency Department (HOSPITAL_COMMUNITY)
Admission: EM | Admit: 2014-10-28 | Discharge: 2014-10-28 | Disposition: A | Discharge status: Home / Self Care | Payer: Medicare Other | Attending: Emergency Medicine | Admitting: Emergency Medicine

## 2014-10-28 DIAGNOSIS — Z7901 Long term (current) use of anticoagulants: Secondary | ICD-10-CM | POA: Diagnosis not present

## 2014-10-28 DIAGNOSIS — I2510 Atherosclerotic heart disease of native coronary artery without angina pectoris: Secondary | ICD-10-CM | POA: Insufficient documentation

## 2014-10-28 DIAGNOSIS — Z7982 Long term (current) use of aspirin: Secondary | ICD-10-CM | POA: Diagnosis not present

## 2014-10-28 DIAGNOSIS — E785 Hyperlipidemia, unspecified: Secondary | ICD-10-CM | POA: Insufficient documentation

## 2014-10-28 DIAGNOSIS — Z79899 Other long term (current) drug therapy: Secondary | ICD-10-CM | POA: Insufficient documentation

## 2014-10-28 DIAGNOSIS — I10 Essential (primary) hypertension: Secondary | ICD-10-CM | POA: Insufficient documentation

## 2014-10-28 DIAGNOSIS — Z720 Tobacco use: Secondary | ICD-10-CM | POA: Diagnosis not present

## 2014-10-28 DIAGNOSIS — Z880 Allergy status to penicillin: Secondary | ICD-10-CM | POA: Insufficient documentation

## 2014-10-28 DIAGNOSIS — J45909 Unspecified asthma, uncomplicated: Secondary | ICD-10-CM | POA: Diagnosis not present

## 2014-10-28 DIAGNOSIS — L509 Urticaria, unspecified: Secondary | ICD-10-CM | POA: Diagnosis not present

## 2014-10-28 DIAGNOSIS — Z87438 Personal history of other diseases of male genital organs: Secondary | ICD-10-CM | POA: Insufficient documentation

## 2014-10-28 DIAGNOSIS — R21 Rash and other nonspecific skin eruption: Secondary | ICD-10-CM | POA: Diagnosis present

## 2014-10-28 MED ORDER — PREDNISONE 20 MG PO TABS: ORAL_TABLET | ORAL | Status: DC

## 2014-10-28 MED ORDER — FAMOTIDINE 20 MG PO TABS
20.0000 mg | ORAL_TABLET | Freq: Once | ORAL | Status: AC
  Administered 2014-10-28: 20 mg via ORAL
  Filled 2014-10-28: qty 1

## 2014-10-28 MED ORDER — METHYLPREDNISOLONE SODIUM SUCC 125 MG IJ SOLR
125.0000 mg | Freq: Once | INTRAMUSCULAR | Status: AC
  Administered 2014-10-28: 125 mg via INTRAMUSCULAR
  Filled 2014-10-28: qty 2

## 2014-10-28 MED ORDER — DIPHENHYDRAMINE HCL 25 MG PO CAPS
25.0000 mg | ORAL_CAPSULE | Freq: Once | ORAL | Status: AC
  Administered 2014-10-28: 25 mg via ORAL
  Filled 2014-10-28: qty 1

## 2014-10-28 NOTE — Discharge Instructions (Signed)
Hives Hives are itchy, red, swollen areas of the skin. They can vary in size and location on your body. Hives can come and go for hours or several days (acute hives) or for several weeks (chronic hives). Hives do not spread from person to person (noncontagious). They may get worse with scratching, exercise, and emotional stress. CAUSES   Allergic reaction to food, additives, or drugs.  Infections, including the common cold.  Illness, such as vasculitis, lupus, or thyroid disease.  Exposure to sunlight, heat, or cold.  Exercise.  Stress.  Contact with chemicals. SYMPTOMS   Red or white swollen patches on the skin. The patches may change size, shape, and location quickly and repeatedly.  Itching.  Swelling of the hands, feet, and face. This may occur if hives develop deeper in the skin. DIAGNOSIS  Your caregiver can usually tell what is wrong by performing a physical exam. Skin or blood tests may also be done to determine the cause of your hives. In some cases, the cause cannot be determined. TREATMENT  Mild cases usually get better with medicines such as antihistamines. Severe cases may require an emergency epinephrine injection. If the cause of your hives is known, treatment includes avoiding that trigger.  HOME CARE INSTRUCTIONS   Avoid causes that trigger your hives.  Take antihistamines as directed by your caregiver to reduce the severity of your hives. Non-sedating or low-sedating antihistamines are usually recommended. Do not drive while taking an antihistamine.  Take any other medicines prescribed for itching as directed by your caregiver.  Wear loose-fitting clothing.  Keep all follow-up appointments as directed by your caregiver. SEEK MEDICAL CARE IF:   You have persistent or severe itching that is not relieved with medicine.  You have painful or swollen joints. SEEK IMMEDIATE MEDICAL CARE IF:   You have a fever.  Your tongue or lips are swollen.  You have  trouble breathing or swallowing.  You feel tightness in the throat or chest.  You have abdominal pain. These problems may be the first sign of a life-threatening allergic reaction. Call your local emergency services (911 in [place] MAKE SURE YOU:   Understand these instructions.  Will watch your condition.  Will get help right away if you are not doing well or get worse. Document Released: 02/13/2005 Document Revised: 02/18/2013 Document Reviewed: 05/09/2011 Exit[employer] Patient Information 2015 [employer]. This information is not intended to replace advice given to you by your health care provider. Make sure you discuss any questions you have with your health care provider.  

## 2014-10-28 NOTE — ED Notes (Signed)
MD at bedside. 

## 2014-10-28 NOTE — ED Notes (Signed)
Pt. reports generalized itchy skin rashes/hives onset 7 pm last night , denies  SOB , airway intact . No fever or chills.

## 2014-10-28 NOTE — ED Provider Notes (Signed)
CSN: [id number]     Arrival date & time 10/28/14  0154 History  This chart was scribed for Loren Racer, MD by Tanda Rockers, ED Scribe. This patient was seen in room D33C/D33C and the patient's care was started at 2:57 AM.  Chief Complaint  Patient presents with  . Urticaria   The history is provided by the patient. No language interpreter was used.     HPI Comments: John Farmer is a 81 y.o. male who presents to the Emergency Department complaining of gradual onset, itching rash diffusely that began last night around 6 PM (approximately 8 hours ago). Pt reports that the itching began on his bilateral arms and has since spread and become more itchy. He mentions eating [employer] roast beef sliders 2 hours prior to onset. He states having similar symptoms 2 weeks ago after eating hot dogs that resolved on its own. Pt denies any new medications recently. He denies difficulty swallowing, shortness of breath, nausea, vomiting, or any associated symptoms.   Past Medical History  Diagnosis Date  . HTN (hypertension)   . ED (erectile dysfunction)   . CAD (coronary artery disease)   . Tobacco abuse   . 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 HLD (hyperlipidemia)   . Complete heart block 12/05/07    s/p PPM  . Moderate asthma   . Tobacco abuse    Past Surgical History  Procedure Laterality Date  . Lumbar hnp    . Tonsillectomy    . Pacemaker insertion  12/05/07    by Fawn Kirk   Family History  Problem Relation Age of Onset  . Coronary artery disease Other     male 1st degree relative <60  . Skin cancer Other   . Stroke Other     F  1st degree relative <60; M 1st degree relative <50  . Thyroid disease Other    Social History  Substance Use Topics  . Smoking status: Current Every Day Smoker  . Smokeless tobacco: None     Comment: Smoked 1 ppd for more than 60 years, very clear that he will not quit.  . Alcohol  Use: Yes     Comment: drinks 2 scotch drinks per day    Review of Systems  Constitutional: Negative for fever and chills.  HENT: Negative for facial swelling, sore throat and trouble swallowing.   Respiratory: Negative for shortness of breath, wheezing and stridor.   Gastrointestinal: Negative for nausea and vomiting.  Skin: Positive for rash.  Neurological: Negative for dizziness, weakness, light-headedness, numbness and headaches.  All other systems reviewed and are negative.  Allergies  Penicillins  Home Medications   Prior to Admission medications   Medication Sig Start Date End Date Taking? Authorizing Provider  amLODipine (NORVASC) 10 MG tablet Take 1 tablet (10 mg total) by mouth daily. 11/17/13  Yes Tonny Bollman, MD  aspirin 81 MG tablet Take 81 mg by mouth daily.     Yes Historical Provider, MD  folic acid (FOLVITE) 1 MG tablet Take 1 mg by mouth daily.   Yes Historical Provider, MD  Glucosamine-Chondroitin (OSTEO BI-FLEX REGULAR STRENGTH PO) Take 1 tablet by mouth daily.    Yes Historical Provider, MD  Misc Natural Products (OSTEO BI-FLEX JOINT SHIELD PO) Take 1 tablet by mouth daily.    Yes Historical Provider, MD  simvastatin (ZOCOR) 20 MG tablet Take 1 tablet (20 mg total) by mouth daily at  6 PM. 11/17/13  Yes Tonny Bollman, MD  warfarin (COUMADIN) 5 MG tablet Take as directed by coumadin clinic Patient taking differently: Take 2.5-5 mg by mouth See admin instructions. Take 1 tablet on Monday, Wednesday and Friday then take 1/2 tablet the other days 02/28/13  Yes Hillis Range, MD  predniSONE (DELTASONE) 20 MG tablet 3 tabs po day one, then 2 po daily x 4 days 10/28/14   Loren Racer, MD   Triage Vitals: BP 115/62 mmHg  Pulse 95  Temp(Src) 97.8 F (36.6 C) (Axillary)  Resp 16  SpO2 97%   Physical Exam  Constitutional: He is oriented to person, place, and time. He appears well-developed and well-nourished. No distress.  HENT:  Head: Normocephalic and atraumatic.   Mouth/Throat: Oropharynx is clear and moist. No oropharyngeal exudate.  No facial or intraoral swelling. Patient speaks in a clear voice.  Eyes: EOM are normal. Pupils are equal, round, and reactive to light.  Neck: Normal range of motion. Neck supple.  Cardiovascular: Normal rate and regular rhythm.   Pulmonary/Chest: Effort normal and breath sounds normal. No stridor. No respiratory distress. He has no wheezes. He has no rales.  Abdominal: Soft. Bowel sounds are normal. He exhibits no distension and no mass. There is no tenderness. There is no rebound and no guarding.  Musculoskeletal: Normal range of motion. He exhibits no edema or tenderness.  Neurological: He is alert and oriented to person, place, and time.  Skin: Skin is warm and dry. Rash noted. No erythema.  Diffuse, raised erythematous plaque-like rash to upper extremities and trunk.  Psychiatric: He has a normal mood and affect. His behavior is normal.  Nursing note and vitals reviewed.   ED Course  Procedures (including critical care time)  DIAGNOSTIC STUDIES: Oxygen Saturation is 97% on RA, normal by my interpretation.    COORDINATION OF CARE: 3:03 AM-Discussed treatment plan which includes solumedrol, pepcid, and benadryl with pt at bedside and pt agreed to plan.   Labs Review Labs Reviewed - No data to display  Imaging Review No results found. I have personally reviewed and evaluated these images and lab results as part of my medical decision-making.   EKG Interpretation None      MDM   Final diagnoses:  Urticaria    I personally performed the services described in this documentation, which was scribed in my presence. The recorded information has been reviewed and is accurate.  Patient observed 3 hours in the emergency department. He's had improvement of his rash. The itching has resolved. Patient's airway remained intact. We'll discharge home with short course of prednisone. He's been advised to follow-up  with his primary physician for possible allergist referral. Return precautions have been given     Loren Racer, MD 10/28/14 [phone]

## 2014-11-16 ENCOUNTER — Encounter: Payer: Self-pay | Admitting: Internal Medicine

## 2014-11-16 ENCOUNTER — Ambulatory Visit (INDEPENDENT_AMBULATORY_CARE_PROVIDER_SITE_OTHER): Payer: Medicare Other | Admitting: Internal Medicine

## 2014-11-16 ENCOUNTER — Other Ambulatory Visit: Payer: Self-pay | Admitting: Cardiovascular Disease

## 2014-11-16 VITALS — BP 162/66 | HR 67 | Ht 67.0 in | Wt 153.6 lb

## 2014-11-16 DIAGNOSIS — I480 Paroxysmal atrial fibrillation: Secondary | ICD-10-CM | POA: Diagnosis not present

## 2014-11-16 DIAGNOSIS — I359 Nonrheumatic aortic valve disorder, unspecified: Secondary | ICD-10-CM | POA: Diagnosis not present

## 2014-11-16 DIAGNOSIS — I714 Abdominal aortic aneurysm, without rupture, unspecified: Secondary | ICD-10-CM

## 2014-11-16 DIAGNOSIS — I442 Atrioventricular block, complete: Secondary | ICD-10-CM | POA: Diagnosis not present

## 2014-11-16 LAB — CUP PACEART INCLINIC DEVICE CHECK
Battery Impedance: 1500 Ohm
Date Time Interrogation Session: 20160919155207
Lead Channel Impedance Value: 395 Ohm
Lead Channel Pacing Threshold Amplitude: 0.375 V
Lead Channel Pacing Threshold Pulse Width: 0.4 ms
Lead Channel Sensing Intrinsic Amplitude: 1.6 mV
Lead Channel Setting Pacing Pulse Width: 0.4 ms
Lead Channel Setting Sensing Sensitivity: 2 mV
MDC IDC MSMT BATTERY VOLTAGE: 2.74 V
MDC IDC MSMT LEADCHNL RV IMPEDANCE VALUE: 419 Ohm
MDC IDC MSMT LEADCHNL RV PACING THRESHOLD AMPLITUDE: 0.875 V
MDC IDC MSMT LEADCHNL RV PACING THRESHOLD PULSEWIDTH: 0.4 ms
MDC IDC SET LEADCHNL RA PACING AMPLITUDE: 2 V
Pulse Gen Model: 5826
Pulse Gen Serial Number: 2205492

## 2014-11-16 NOTE — Progress Notes (Signed)
PCP: BURNETT,BRENT A, MD  John Farmer is a 81 y.o. male who presents today for routine electrophysiology followup.  Since last being seen in our clinic, the patient reports doing very well.   He continues to smoke.  He continues to smoke and is not willing to quit.  His had advanced AS for which he is followed by Dr Excell Seltzer.  Today, he denies symptoms of palpitations, chest pain, shortness of breath,  lower extremity edema, dizziness, presyncope, or syncope.  The patient is otherwise without complaint today.   Past Medical History  Diagnosis Date  . HTN (hypertension)   . ED (erectile dysfunction)   . CAD (coronary artery disease)   . Tobacco abuse   . 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 HLD (hyperlipidemia)   . Complete heart block 12/05/07    s/p PPM  . Moderate asthma   . Tobacco abuse    Past Surgical History  Procedure Laterality Date  . Lumbar hnp    . Tonsillectomy    . Pacemaker insertion  12/05/07    by Fawn Kirk    Current Outpatient Prescriptions  Medication Sig Dispense Refill  . amLODipine (NORVASC) 10 MG tablet Take 1 tablet (10 mg total) by mouth daily. 90 tablet 3  . aspirin 81 MG tablet Take 81 mg by mouth daily.      . folic acid (FOLVITE) 1 MG tablet Take 1 mg by mouth daily.    . Glucosamine-Chondroitin (OSTEO BI-FLEX REGULAR STRENGTH PO) Take 1 tablet by mouth daily.     Marland Kitchen OVER THE COUNTER MEDICATION Take 1-2 capsules by mouth daily. Omega XL    . predniSONE (DELTASONE) 20 MG tablet 3 tabs po day one, then 2 po daily x 4 days 11 tablet 0  . warfarin (COUMADIN) 5 MG tablet Take as directed by coumadin clinic (Patient taking differently: Take 2.5-5 mg by mouth See admin instructions. Take 1 tablet on Monday, Wednesday and Friday then take 1/2 tablet the other days) 30 tablet 3  . simvastatin (ZOCOR) 20 MG tablet Take 1 tablet (20 mg total) by mouth daily at 6 PM. (Patient not taking: Reported  on 11/16/2014) 90 tablet 3   No current facility-administered medications for this visit.    Physical Exam: Filed Vitals:   11/16/14 1150  BP: 162/66  Pulse: 67  Height:  (1.702 m)  Weight: 153 lb 9.6 oz (69.673 kg)    GEN- The patient is well appearing, alert and oriented x 3 today.   Head- normocephalic, atraumatic Eyes-  Sclera clear, conjunctiva pink Ears- hearing intact Oropharynx- clear Lungs- Clear to ausculation bilaterally, normal work of breathing Chest- pacemaker pocket is well healed Heart- Regular rate and rhythm,2/6 SEM LUSB which is late peaking GI- soft, NT, ND, + BS Extremities- no clubbing, cyanosis, or edema  Pacemaker interrogation- reviewed in detail today,  See PACEART report  Assessment and Plan:  1. afib asymptomatic Anticoagulated with coumadin  2. Complete heart block Normal pacemaker function See Pace Art report No changes today  3. PVD/CAD No changes as he is clinically stable  4. HTN Stable No change required today  5. Tobacco- cessation strongly advised He is not ready to quit  6. AS (moderate to severe) No symptoms Dr Excell Seltzer is following also  Return to see Dr Excell Seltzer in 6 months.  Device clinic check at thatDelorse Lek to see EP NP in 1 year

## 2014-11-16 NOTE — Patient Instructions (Signed)
Medication Instructions:  Your physician recommends that you continue on your current medications as directed. Please refer to the Current Medication list given to you today.   Labwork: None ordered  Testing/Procedures: None rdered  Follow-Up: Your physician wants you to follow-up in: 6 months in the device clinic and 12 months with Gypsy Balsam, NP You will receive a reminder letter in the mail two months in advance. If you don't receive a letter, please call our office to schedule the follow-up appointment.   Any Other Special Instructions Will Be Listed Below (If Applicable).

## 2014-11-23 ENCOUNTER — Ambulatory Visit (HOSPITAL_COMMUNITY)
Admission: RE | Admit: 2014-11-23 | Discharge: 2014-11-23 | Disposition: A | Discharge status: Home / Self Care | Payer: Medicare Other | Source: Ambulatory Visit | Attending: Cardiovascular Disease | Admitting: Cardiovascular Disease

## 2014-11-23 DIAGNOSIS — I708 Atherosclerosis of other arteries: Secondary | ICD-10-CM | POA: Diagnosis not present

## 2014-11-23 DIAGNOSIS — I714 Abdominal aortic aneurysm, without rupture, unspecified: Secondary | ICD-10-CM

## 2014-11-23 DIAGNOSIS — F17200 Nicotine dependence, unspecified, uncomplicated: Secondary | ICD-10-CM | POA: Insufficient documentation

## 2014-11-23 DIAGNOSIS — E785 Hyperlipidemia, unspecified: Secondary | ICD-10-CM | POA: Insufficient documentation

## 2014-11-23 DIAGNOSIS — I10 Essential (primary) hypertension: Secondary | ICD-10-CM | POA: Insufficient documentation

## 2014-12-14 ENCOUNTER — Encounter: Payer: Self-pay | Admitting: Internal Medicine

## 2014-12-31 NOTE — Progress Notes (Signed)
Cardiology Office Note Date:  01/03/2015   ID:  John [place], DOB Oct 23, 1930, MRN [id number]  PCP:  Delorse Lek, MD  Cardiologist:  Tonny Bollman, MD    Chief Complaint  Patient presents with  . Dizziness   History of Present Illness: [place] is a 81 y.o. male who presents for follow-up of severe aortic stenosis.   The patient has been followed for several years with aortic stenosis. He has been managed conservatively because of lack of symptoms. He also has a history of paroxysmal atrial fibrillation and complete heart block status post pacemaker implantation.  From a symptomatic perspective, he is doing well. He complains of unsteadiness/dizziness when he first wakes up in the morning. This is long-standing. He states that it just takes him a few minutes to steady himself. He has not had presyncope. He has no dizziness or lightheadedness associated with physical exertion. He denies chest pain or pressure, shortness of breath, or other symptoms with his normal activities. He walks up and down the stairs several times per day without symptoms. He continues to smoke cigarettes, approximately one half pack per day.  Past Medical History  Diagnosis Date  . HTN (hypertension)   . ED (erectile dysfunction)   . CAD (coronary artery disease)   . Tobacco abuse   . 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 HLD (hyperlipidemia)   . Complete heart block (HCC) 12/05/07    s/p PPM  . Moderate asthma   . Tobacco abuse     Past Surgical History  Procedure Laterality Date  . Lumbar hnp    . Tonsillectomy    . Pacemaker insertion  12/05/07    by Fawn Kirk    Current Outpatient Prescriptions  Medication Sig Dispense Refill  . amLODipine (NORVASC) 10 MG tablet Take 1 tablet (10 mg total) by mouth daily. 90 tablet 3  . aspirin 81 MG tablet Take 81 mg by mouth daily.      Marland Kitchen FLUZONE HIGH-DOSE 0.5 ML SUSY  inject 0.5 milliliter intramuscularly  0  . folic acid (FOLVITE) 1 MG tablet Take 1 mg by mouth daily.    . Glucosamine-Chondroitin (OSTEO BI-FLEX REGULAR STRENGTH PO) Take 1 tablet by mouth daily.     . naproxen sodium (ANAPROX) 220 MG tablet Take 220 mg by mouth daily.    Marland Kitchen OVER THE COUNTER MEDICATION Take 1-2 capsules by mouth daily. Omega XL    . simvastatin (ZOCOR) 20 MG tablet Take 1 tablet (20 mg total) by mouth daily at 6 PM. 90 tablet 3  . warfarin (COUMADIN) 5 MG tablet Take as directed by coumadin clinic (Patient taking differently: Take 2.5-5 mg by mouth See admin instructions. Take 1 tablet on Monday, Wednesday and Friday then take 1/2 tablet the other days) 30 tablet 3   No current facility-administered medications for this visit.    Allergies:   Penicillins   Social History:  The patient  reports that he has been smoking.  He does not have any smokeless tobacco history on file. He reports that he drinks alcohol. He reports that he does not use illicit drugs.   Family History:  The patient's  family history includes Coronary artery disease in his other; Skin cancer in his other; Stroke in his other; Thyroid disease in his other. There is no history of Heart attack or Hypertension.    ROS:  Please see the history of  present illness.  Otherwise, review of systems is positive for rash, easy bruising, lightheadedness.  All other systems are reviewed and negative.    PHYSICAL EXAM: VS:  BP 120/60 mmHg  Pulse 74  Ht 5\' 7"  (1.702 m)  Wt 154 lb (69.854 kg)  BMI 24.11 kg/m2 , BMI Body mass index is 24.11 kg/(m^2). GEN: Well nourished, well developed, pleasant elderly male in no acute distress HEENT: normal Neck: no JVD, no masses. bilateral carotid bruits Cardiac: RRR with harsh late peaking systolic murmur at the right upper sternal border, absent A2           Respiratory:  clear to auscultation bilaterally, normal work of breathing GI: soft, nontender, nondistended, + BS MS:  no deformity or atrophy Ext: no pretibial edema Skin: warm and dry, no rash Neuro:  Strength and sensation are intact Psych: euthymic mood, full affect  EKG:  EKG is ordered today. The ekg ordered today shows atrial sensed ventricular paced rhythm 74 bpm  Recent Labs: No results found for requested labs within last 365 days.   Lipid Panel     Component Value Date/Time   CHOL 204* 08/16/2009 0000   TRIG 84.0 08/16/2009 0000   HDL 39.00* 08/16/2009 0000   CHOLHDL 5 08/16/2009 0000   VLDL 16.8 08/16/2009 0000   LDLCALC 88 11/27/2008 0000   LDLDIRECT 160.1 08/16/2009 0000      Wt Readings from Last 3 Encounters:  01/01/15 154 lb (69.854 kg)  11/16/14 153 lb 9.6 oz (69.673 kg)  05/18/14 166 lb 2 oz (75.354 kg)     Cardiac Studies Reviewed: 2D Echo 05-20-14: Study Conclusions  - Left ventricle: The cavity size was normal. Wall thickness was increased in a pattern of mild LVH. The estimated ejection fraction was 55%. Although no diagnostic regional wall motion abnormality was identified, this possibility cannot be completely excluded on the basis of this study. Doppler parameters are consistent with abnormal left ventricular relaxation (grade 1 diastolic dysfunction). - Aortic valve: Poorly visualized. Severely calcified leaflets. There was severe stenosis. There was mild regurgitation. Mean gradient (S): 55 mm Hg. Valve area (VTI): 0.81 cm^2. - Mitral valve: Mildly calcified annulus. Mildly calcified leaflets . There was trivial regurgitation. - Left atrium: The atrium was mildly dilated. - Right ventricle: The cavity size was normal. Pacer wire or catheter noted in right ventricle. Systolic function was normal. - Atrial septum: Possible small PFO. - Tricuspid valve: Peak RV-RA gradient (S): 22 mm Hg. - Pulmonary arteries: PA peak pressure: 25 mm Hg (S). - Inferior vena cava: The vessel was normal in size. The respirophasic diameter changes were in  the normal range (= 50%), consistent with normal central venous pressure.  Impressions:  - Normal LV size with EF 55%. Mild LV hypertrophy. Normal RV size and systolic function. Severe aortic stenosis.   ASSESSMENT AND PLAN: 1.  Severe aortic stenosis: New York Heart Association functional class I symptoms. I do not think his morning unsteadiness is related to aortic stenosis. He has no other symptoms, and specifically denies any exertional symptoms. Will continue with careful observation. I counseled him on potential symptoms including chest pain or pressure, shortness of breath, or presyncope. Will follow-up with an echocardiogram and repeat office visit in 6 months. His physical exam and echo are clearly consistent with severe aortic stenosis and I suspect he will require intervention within the next 1-2 years.  2. Paroxysmal atrial fibrillation: A-sensed, Ventricular paced rhythm today, in sinus. Tolerating warfarin without bleeding problems  3. Tobacco abuse: Cessation counseling done. No real interested in quitting.  4. Hypertension: Blood pressure controlled with amlodipine  Current medicines are reviewed with the patient today.  The patient does not have concerns regarding medicines.  Labs/ tests ordered today include:   Orders Placed This Encounter  Procedures  . EKG 12-Lead  . Echocardiogram    Disposition:   FU 6 months with an echo prior to the visit  Signed, Tonny Bollman, MD  01/03/2015 9:22 PM    [facility] [address] [place], [place], [place]  [address] Phone: [phone]; Fax: [phone]

## 2015-01-01 ENCOUNTER — Encounter: Payer: Self-pay | Admitting: Cardiovascular Disease

## 2015-01-01 ENCOUNTER — Ambulatory Visit (INDEPENDENT_AMBULATORY_CARE_PROVIDER_SITE_OTHER): Payer: Medicare Other | Admitting: Cardiovascular Disease

## 2015-01-01 VITALS — BP 120/60 | HR 74 | Ht 67.0 in | Wt 154.0 lb

## 2015-01-01 DIAGNOSIS — I10 Essential (primary) hypertension: Secondary | ICD-10-CM | POA: Diagnosis not present

## 2015-01-01 DIAGNOSIS — I359 Nonrheumatic aortic valve disorder, unspecified: Secondary | ICD-10-CM | POA: Diagnosis not present

## 2015-01-01 DIAGNOSIS — I350 Nonrheumatic aortic (valve) stenosis: Secondary | ICD-10-CM

## 2015-01-01 DIAGNOSIS — I2510 Atherosclerotic heart disease of native coronary artery without angina pectoris: Secondary | ICD-10-CM

## 2015-01-01 NOTE — Patient Instructions (Signed)
Medication Instructions:  Your physician recommends that you continue on your current medications as directed. Please refer to the Current Medication list given to you today.  Labwork: No new orders.   Testing/Procedures: Your physician has requested that you have an echocardiogram in 6 MONTHS. Echocardiography is a painless test that uses sound waves to create images of your heart. It provides your doctor with information about the size and shape of your heart and how well your heart's chambers and valves are working. This procedure takes approximately one hour. There are no restrictions for this procedure.  Follow-Up: Your physician wants you to follow-up in: 6 MONTHS with Dr Cooper.  You will receive a reminder letter in the mail two months in advance. If you don't receive a letter, please call our office to schedule the follow-up appointment.   Any Other Special Instructions Will Be Listed Below (If Applicable).     If you need a refill on your cardiac medications before your next appointment, please call your pharmacy.   

## 2015-02-16 ENCOUNTER — Ambulatory Visit (INDEPENDENT_AMBULATORY_CARE_PROVIDER_SITE_OTHER): Payer: Medicare Other | Admitting: Neurology

## 2015-02-16 ENCOUNTER — Encounter: Payer: Self-pay | Admitting: Neurology

## 2015-02-16 VITALS — BP 152/70 | HR 72 | Resp 16 | Ht 67.0 in | Wt 155.0 lb

## 2015-02-16 DIAGNOSIS — R011 Cardiac murmur, unspecified: Secondary | ICD-10-CM

## 2015-02-16 DIAGNOSIS — Z950 Presence of cardiac pacemaker: Secondary | ICD-10-CM

## 2015-02-16 DIAGNOSIS — F17210 Nicotine dependence, cigarettes, uncomplicated: Secondary | ICD-10-CM | POA: Diagnosis not present

## 2015-02-16 DIAGNOSIS — F17200 Nicotine dependence, unspecified, uncomplicated: Secondary | ICD-10-CM

## 2015-02-16 DIAGNOSIS — F0390 Unspecified dementia without behavioral disturbance: Secondary | ICD-10-CM

## 2015-02-16 MED ORDER — DONEPEZIL HCL 5 MG PO TABS: 5.0000 mg | ORAL_TABLET | Freq: Every day | ORAL | Status: DC

## 2015-02-16 NOTE — Progress Notes (Signed)
Subjective:    Patient ID: John [place] is a 81 y.o. male.  HPI     John Foley, MD, PhD [facility] [address], [address] [address] [place], [place] [address]  Dear Dr. Doristine Farmer,   I saw your patient, John [place], upon your kind request in my neurologic clinic today for initial consultation of his memory loss. The patient is unaccompanied today (and drove himself to appt). As you know, John [place] is a 81 year old right-handed gentleman with a complex medical history of peripheral artery disease, hypertension, coronary artery disease, hyperlipidemia, complete heart block, status post pacemaker placement in 2009, asthma, A. fib, aortic stenosis, abdominal aortic aneurysm, status post heart valve replacement, and long-standing history of smoking, who reports memory loss for the past months or perhaps longer. He reports that his daughter pushed him for this appointment. She has been worried about his memory. He has noticed some short-term memory issues including forgetfulness and misplacing things. He has not noticed much in the way of hearing issues but he clearly has trouble hearing. He then adds that he does not wish to have any hearing aids. He denies any confusion, disorientation, delusions or hallucinations. He still drives. He reports that his daughter wanted his driver's license taken away but he had testing done through the state and they did not take his license away. I'm not sure when this all happened. It sounds like he had a full driver's evaluation. He reports that he also had his eyes checked for that purpose. He does not drink water. He drinks coffee, 6-7 cups per day. He smokes cigarettes, 5-6 per day. He drinks alcohol but not daily. He says he ran out of [place] at the house.  I reviewed your office note from 11/20/2014.   He is followed by cardiology, and sees Dr. Excell Farmer usually and Dr. Johney Farmer for his pacemaker checkups.  He had a brain MRI without  contrast on 05/31/2005, and I reviewed the report: This MRI scan of the brain shows only mild degree of periventricular and subcortical white matter hyperintensities and mild degree of generalized cerebral atrophy. No structural lesion, tumors or infarction are identified.  He lives with his daughter, John [place] (middle child), for the past 14 years. He has a total of 2 daughters and one son (whom he has "disinherited"). He does not see his other daughter very much.  All children are from his 1st wife, has been divorced x 3, but is on good terms with the 3rd wife, talks to her on the phone regularly.   Patient's mother lived to be nearly 55 yo and had memory loss in her later years (she "dipped snuff"). His father passed away at 39 yo (he smoked).   He denies any recent falls. He does not use a walking aid and states that he paces himself. He does not exercise regularly.  His Past Medical History Is Significant For: Past Medical History  Diagnosis Date  . HTN (hypertension)   . ED (erectile dysfunction)   . CAD (coronary artery disease)   . Tobacco abuse   . 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 HLD (hyperlipidemia)   . Complete heart block (HCC) 12/05/07    s/p PPM  . Moderate asthma   . Tobacco abuse     His Past Surgical History Is Significant For: Past Surgical History  Procedure Laterality Date  . Lumbar hnp    .  Tonsillectomy    . Pacemaker insertion  12/05/07    by JA    His Family History Is Significant For: Family History  Problem Relation Age of Onset  . Coronary artery disease Other     male 1st degree relative <60  . Skin cancer Other   . Stroke Other     F  1st degree relative <60; M 1st degree relative <50  . Thyroid disease Other   . Heart attack Neg Hx   . Hypertension Neg Hx   . Dementia Mother     His Social History Is Significant For: Social History   Social History  .  Marital Status: Single    Spouse Name: N/A  . Number of Children: 3  . Years of Education: N/A   Occupational History  . Insurance      Semi-retired   Social History Main Topics  . Smoking status: Current Every Day Smoker  . Smokeless tobacco: None     Comment: Smoked 1 ppd for more than 60 years, very clear that he will not quit.  . Alcohol Use: 0.0 oz/week    0 Standard drinks or equivalent per week     Comment: scotch  . Drug Use: No  . Sexual Activity: Not Asked   Other Topics Concern  . None   Social History Narrative   Lives in [place]. 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    Drinks 4-5 cups of coffee daily     His Allergies Are:  Allergies  Allergen Reactions  . Penicillins     REACTION: Severe Hives  :   His Current Medications Are:  Outpatient Encounter Prescriptions as of 02/16/2015  Medication Sig  . amLODipine (NORVASC) 10 MG tablet Take 1 tablet (10 mg total) by mouth daily.  Marland Kitchen aspirin 81 MG tablet Take 81 mg by mouth daily.    Marland Kitchen Bioflavonoid Products (BIOFLEX PO) Take by mouth.  Marland Kitchen FLUZONE HIGH-DOSE 0.5 ML SUSY inject 0.5 milliliter intramuscularly  . folic acid (FOLVITE) 1 MG tablet Take 1 mg by mouth daily.  . Glucosamine-Chondroitin (OSTEO BI-FLEX REGULAR STRENGTH PO) Take 1 tablet by mouth daily.   . naproxen sodium (ANAPROX) 220 MG tablet Take 220 mg by mouth daily.  . Omega-3 Fatty Acids (OMEGA 3 PO) Take by mouth.  Marland Kitchen OVER THE Farmer MEDICATION Take 1-2 capsules by mouth daily. Omega XL  . simvastatin (ZOCOR) 20 MG tablet Take 1 tablet (20 mg total) by mouth daily at 6 PM.  . warfarin (COUMADIN) 5 MG tablet Take as directed by coumadin clinic (Patient taking differently: Take 2.5-5 mg by mouth See admin instructions. Take 1 tablet on Monday, Wednesday and Friday then take 1/2 tablet the other days)  . donepezil (ARICEPT) 5 MG tablet Take 1 tablet (5 mg total) by mouth at bedtime.   No facility-administered encounter medications on file as of  02/16/2015.  :  Review of Systems:  Out of a complete 14 point review of systems, all are reviewed and negative with the exception of these symptoms as listed below:  Review of Systems  Neurological:       Memory loss, restless legs   Hematological: Bruises/bleeds easily.  Psychiatric/Behavioral:       Decreased energy    Fall Risk  02/16/2015  Falls in the past year? No  Risk for fall due to : Impaired balance/gait;Other (Comment)  Risk for fall due to (comments): memory loss and advanced age    Objective:  Neurologic  Exam  Physical Exam  Physical Examination:   Filed Vitals:   02/16/15 0913  BP: 152/70  Pulse: 72  Resp: 16   General Examination: The patient is a very pleasant 81 y.o. male in no acute distress. He is calm and cooperative with the exam. He denies Auditory Hallucinations and Visual Hallucinations. He is well groomed and situated in a chair.   HEENT: Normocephalic, atraumatic, pupils are equal, round and reactive to light and accommodation. Funduscopic exam is normal with sharp disc margins noted. Extraocular tracking shows mild saccadic breakdown without nystagmus noted. Hearing is impaired. Face is symmetric with no facial masking and normal facial sensation. There is no lip, neck or jaw tremor. Neck is not rigid with intact passive ROM. There are no carotid bruits on auscultation. Oropharynx exam reveals severe mouth dryness. No significant airway crowding is noted. Mallampati is class II. Tongue protrudes centrally and palate elevates symmetrically.    Chest: is clear to auscultation without wheezing, rhonchi or crackles noted.  Heart: sounds are regular and he has a prominent pansystolic murmur.    Abdomen: is soft, non-tender and non-distended with normal bowel sounds appreciated on auscultation.  Extremities: There is no pitting edema in the distal lower extremities bilaterally. Pedal pulses are intact.   Skin: is warm but very dry and flaky. He has  multiple bruises across his forearms, especially over the dorsum of both hands. Of note, he is on Coumadin.   Musculoskeletal: exam reveals no obvious joint deformities, tenderness or joint swelling or erythema. 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   Neurologically:  Mental status: The patient is awake and alert, paying good  attention. He is able to provide the history.  He is oriented to: person, place, time/date, day of week, month of year and year. His memory, attention, language and knowledge are impaired mildly. There is no aphasia, agnosia, apraxia or anomia. There is a no significant bradyphrenia. Speech is not hypophonic with no dysarthria noted. Mood is congruent and affect is normal.   On 02/16/2015: His MMSE (Mini-Mental state exam) score is 25/30.  CDT (Clock Drawing Test) score is 3/4.  AFT (Animal Fluency Test) score is 11.   Cranial nerves are as described above under HEENT exam. In addition, shoulder shrug is normal with equal shoulder height noted.  Motor exam: Normal bulk, and strength for age is noted. Tone is not rigid with absence of cogwheeling. There is overall no bradykinesia. There is no drift or rebound. There is no tremor.  Romberg is negative, except for mild swaying. Reflexes are 1+ in the upper extremities and 1+ in the lower extremities with absence of ankle jerks. Toes are downgoing bilaterally. Fine motor skills:  are generally speaking mildly impaired throughout. Cerebellar testing shows no dysmetria or intention tremor on finger to nose testing. Heel to shin is unremarkable. There is no truncal or gait ataxia.   Sensory exam is intact to light touch, pinprick, vibration, temperature sense in the upper  extremities with decrease in vibration sense in the distal lower extremities and mild decrease in temperature sense in the distal lower extremities.   Gait, station and balance: He stands up from the seated position with no significant  difficulty and stands slightly wide-based. Posture is age-appropriate. He walks with preserved arm swing bilaterally. Tandem walk is not possible for him without help or holding on.    Assessment and Plan:   In summary, John Nearing[place] E [place] is a very pleasant 81  y.o.-year old male with a complex medical history of peripheral artery disease, hypertension, coronary artery disease, hyperlipidemia, complete heart block, status post pacemaker placement in 2009, asthma, A. fib, aortic stenosis, abdominal aortic aneurysm, status post heart valve replacement, and long-standing history of smoking, who reports memory loss for the past months or perhaps longer. He has multiple vascular risk factors. Some 9+ years ago he had a brain MRI which showed mild white matter changes and mild atrophy. Given his pacemaker status he cannot have an MRI. I would like to proceed with a head CT without contrast. His memory scores and today's evaluation indicate overall mild memory loss, possibly mild vascular dementia without behavioral disturbance. I'm somewhat worried about his driving but it is difficult to assess his driving on the basis of today's exam. Nevertheless, it sounds like he has had driving evaluation done through the state. I'm not sure as to when this was actually done.  I'm not sure when he had his last blood work. I suggested he be checked for thyroid dysfunction and B12 deficiency at his next blood work through your office.  He is advised to drink more water and reduce his coffee intake. He is furthermore advised to quit smoking but he appears to be not motivated to quit. He states that he quit about 14 or 15 years ago for 6 months but gained about 20 pounds at the time and resumed smoking.  I talked to him about his memory loss and treatment options. I would like to suggest low-dose Aricept generic, 5 mg once daily and talked to him about potential side effects and gave him instructions in writing. He does have a  history of bradycardia but has a pacemaker in place and has regular checkup for his heart. I will copy Dr. Excell Farmer on my note today as well.  We talked about smoking cessation and about maintaining a healthy lifestyle in general and staying active mentally and physically. I encouraged the patient to eat healthy, exercise daily and keep well hydrated, to keep a scheduled bedtime and wake time routine, to not skip any meals and eat healthy snacks in between meals and to have protein with every meal. I stressed the importance of regular exercise, within of course the patient's own mobility limitations. I encouraged the patient to keep up with current events by reading the news paper or watching the news and to do word puzzles, or if feasible, to go on [employer][email or web address]. I would like to see him back for a routine checkup in 3-4 months, sooner if needed. I would appreciate if his daughter could accompany him to his appointment next time.  I answered all his questions today and the patient was in agreement with the above outlined plan. I encouraged him to call with any interim questions, concerns, problems, updates and refill requests and/or test results.   Thank you very much for allowing me to participate in the care of this nice patient. If I can be of any further assistance to you please do not hesitate to call me at [phone].  Sincerely,   John Foley, MD, PhD

## 2015-02-16 NOTE — Patient Instructions (Signed)
I think overall you are doing fairly well but I do want to suggest a few things today:  Remember to drink plenty of fluid, eat healthy meals and do not skip any meals. Try to eat protein with a every meal and eat a healthy snack such as fruit or nuts in between meals. Try to keep a regular sleep-wake schedule and try to exercise daily, particularly in the form of walking, 20-30 minutes a day, if you can. Good nutrition, proper sleep and exercise can help your memory and cognitive function.  Monitor your driving abilities and have your family observe your driving skills from time to time.   Engage in social activities in your community and with your family and try to keep up with current events by reading the newspaper or watching the news. If you have computer and can go online, try [employer][email or web address][email or web address]. Also, you may like to do word finding puzzles or crossword puzzles.  As far as your medications are concerned, I would like to suggest Aricept (generic name: donepezil) 5 mg: take one pill each evening. Common side effects include dry eyes, dry mouth, confusion, low pulse, low blood pressure and rare side effects include hallucinations.   As far as diagnostic testing: We will do a brain CT scan, call you with the test results. We will have to schedule you for this on a separate date. This test requires authorization from your insurance, and we will take care of the insurance process.  I would like to see you back in 3-4 months, sooner if we need to. Please call us with any interim questions, concerns, problems, updates or refill requests.  [place] is my nurse and will answer any of your questions and relay your messages to me and also relay my messages to you.  Our phone number is [phone]. We also have an after hours call service for urgent matters and there is a physician on-call for urgent questions. For any emergencies you know to call 911 or go to the nearest emergency room.

## 2015-03-10 ENCOUNTER — Telehealth: Payer: Self-pay

## 2015-03-10 NOTE — Telephone Encounter (Signed)
Marylu LundJanet came to me with a message from patient wanting to cancel his appts with Dr. Frances FurbishAthar.  I spoke to him this afternoon to f/u. He [facility]. He states that the nurse said that he just needs to take him "memory pill" and "does not need to see Dr. Frances FurbishAthar anymore."  The phone calls that he missed (thinking that they were from us) are [facility]. He voiced that he did not need the scan done. He is going to call us back with the phone number to the nurse that gave his examination.

## 2015-03-24 NOTE — Telephone Encounter (Signed)
I called the insurance company to see if they can fix this. They stated that they did not know what department to send me to and had "no way" of tracking which nurse visited the patient.   I called the daughter (on [place]) advised her of what is going on. She asked [place] to keep the 06/17/15 appt and that she will talk to the patient about it tonight. She is also aware that patient needs CT scan. I will contact Sue Lush about rescheduling this with the daughter. [name] [phone].

## 2015-03-24 NOTE — Telephone Encounter (Signed)
John Farmer also states that she has had some trouble with the patient lately. Example: He bought a $20,000 car on Saturday and according to the daughter he does not have the money. On Sunday she reports that he was "combative".

## 2015-04-07 ENCOUNTER — Ambulatory Visit
Admission: RE | Admit: 2015-04-07 | Discharge: 2015-04-07 | Disposition: A | Discharge status: Home / Self Care | Payer: Medicare Other | Source: Ambulatory Visit | Attending: Neurology | Admitting: Neurology

## 2015-04-07 DIAGNOSIS — F0390 Unspecified dementia without behavioral disturbance: Secondary | ICD-10-CM | POA: Diagnosis not present

## 2015-04-07 DIAGNOSIS — F17200 Nicotine dependence, unspecified, uncomplicated: Secondary | ICD-10-CM

## 2015-04-09 NOTE — Progress Notes (Signed)
Quick Note:  Please call patient regarding the recent head CT: The brain scan showed a normal structure of the brain overall, but mild to moderate volume loss which we call atrophy. There were changes in the deeper structures of the brain, which we call white matter changes or microvascular changes. These were reported as mild to moderate in His case. These are tiny white spots, that occur with time and are seen in a variety of conditions, including with normal aging, chronic hypertension, chronic headaches, especially migraine HAs, chronic diabetes, chronic hyperlipidemia. These are not strokes and no mass or lesion was seen which is reassuring. Again, there were no acute findings, such as a stroke, or mass or blood products. No further action is required on this test at this time, other than re-enforcing the importance of good blood pressure control, good cholesterol control, good blood sugar control, and weight management. Please remind patient to keep any upcoming appointments or tests and to call us with any interim questions, concerns, problems or updates. Thanks,  [name], MD, PhD    ______

## 2015-04-12 ENCOUNTER — Telehealth: Payer: Self-pay

## 2015-04-12 NOTE — Telephone Encounter (Signed)
We can move up appt, I would appreciate if daughter can be there. I started him on Aricept, is he taking it?  Please offer sooner appt when daughter can be there too, thx

## 2015-04-12 NOTE — Telephone Encounter (Signed)
On 03/24/15, I contacted daughter about patient missing CT scan. Patient felt like he did not need any treatment. The daughter helped the patient get rescheduled for CT scan. She gave me information below: Eunice Blase also states that she has had some trouble with the patient lately. Example: He bought a $20,000 car on Saturday and according to the daughter he does not have the money. On Sunday she reports that he was "combative".          Do you want to keep scheduled appointment? Or any further recommendations?

## 2015-04-12 NOTE — Telephone Encounter (Signed)
-----   Message from [name], MD sent at 04/09/2015 10:36 AM EST ----- Please call patient regarding the recent head CT: The brain scan showed a normal structure of the brain overall, but mild to moderate volume loss which we call atrophy. There were changes in the deeper structures of the brain, which we call white matter changes or microvascular changes. These were reported as mild to moderate in His case. These are tiny white spots, that occur with time and are seen in a variety of conditions, including with normal aging, chronic hypertension, chronic headaches, especially migraine HAs, chronic diabetes, chronic hyperlipidemia. These are not strokes and no mass or lesion was seen which is reassuring. Again, there were no acute findings, such as a stroke, or mass or blood products. No further action is required on this test at this time, other than re-enforcing the importance of good blood pressure control, good cholesterol control, good blood sugar control, and weight management. Please remind patient to keep any upcoming appointments or tests and to call us with any interim questions, concerns, problems or updates. Thanks,  [name], MD, PhD

## 2015-04-14 NOTE — Telephone Encounter (Signed)
I spoke to daughter and she is aware of results. We were able to move appt up. She will ceck on him and see if he is taking his medication correctly.

## 2015-04-14 NOTE — Telephone Encounter (Signed)
John Farmer returned Diana's call

## 2015-04-14 NOTE — Telephone Encounter (Signed)
LM for daughter with results and asked her to call back and move up appt.

## 2015-04-14 NOTE — Telephone Encounter (Signed)
Pt's daughter returned Diana's call

## 2015-04-14 NOTE — Telephone Encounter (Signed)
LM for daughter to call back for results and to move patient's appt up to a time she can come with him.

## 2015-04-23 ENCOUNTER — Telehealth: Payer: Self-pay | Admitting: *Deleted

## 2015-04-23 DIAGNOSIS — F0390 Unspecified dementia without behavioral disturbance: Secondary | ICD-10-CM

## 2015-04-23 MED ORDER — DONEPEZIL HCL 5 MG PO TABS: 5.0000 mg | ORAL_TABLET | Freq: Every day | ORAL | Status: DC

## 2015-04-23 NOTE — Telephone Encounter (Signed)
aricept 5 mg once daily, 90 day Rx sent with 3 refills to [facility]

## 2015-04-27 ENCOUNTER — Ambulatory Visit (INDEPENDENT_AMBULATORY_CARE_PROVIDER_SITE_OTHER): Payer: Medicare Other | Admitting: Neurology

## 2015-04-27 ENCOUNTER — Encounter: Payer: Self-pay | Admitting: Neurology

## 2015-04-27 VITALS — BP 128/60 | HR 72 | Resp 16 | Ht 67.0 in | Wt 156.0 lb

## 2015-04-27 DIAGNOSIS — F17210 Nicotine dependence, cigarettes, uncomplicated: Secondary | ICD-10-CM | POA: Diagnosis not present

## 2015-04-27 DIAGNOSIS — Z9181 History of falling: Secondary | ICD-10-CM | POA: Diagnosis not present

## 2015-04-27 DIAGNOSIS — R011 Cardiac murmur, unspecified: Secondary | ICD-10-CM | POA: Diagnosis not present

## 2015-04-27 DIAGNOSIS — F0391 Unspecified dementia with behavioral disturbance: Secondary | ICD-10-CM

## 2015-04-27 DIAGNOSIS — F17200 Nicotine dependence, unspecified, uncomplicated: Secondary | ICD-10-CM

## 2015-04-27 DIAGNOSIS — F03918 Unspecified dementia, unspecified severity, with other behavioral disturbance: Secondary | ICD-10-CM

## 2015-04-27 NOTE — Patient Instructions (Signed)
We will request a formal neuropsychological test (aka cognitive testing) for your memory complaints. This requires a referral to a trained and licensed neuropsychologist and will be a separate appointment at a different clinic.   I will request that Dr. Doristine Counter initiate a driving evaluation through the [facility].   We may consider a referral to psychology and psychiatry, likely geriatric psychiatrist in the near future.  Please continue your donepezil at 5 mg once daily.   Please reduce your coffee and soda intake and drink more water.   Please stop smoking.   We will do a 3 month follow up here.

## 2015-04-27 NOTE — Progress Notes (Signed)
Subjective:    Patient ID: John Farmer is a 82 y.o. male.  HPI     Interim history:  John Farmer is a 82 year old right-handed gentleman with a complex medical history of peripheral artery disease, hypertension, coronary artery disease, hyperlipidemia, complete heart block, status post pacemaker placement in 2009, asthma, A. fib, aortic stenosis, abdominal aortic aneurysm, status post heart valve replacement, and long-standing history of smoking, who presents for follow-up consultation of his memory loss. The patient is accompanied by his daughter John Farmer today. I first met him on 02/16/2015 at the request of his primary care physician, at which time the patient reported a several month history of memory loss. His daughter was worried about his memory he reported at the time. His MMSE was 25/30, CDT (Clock Drawing Test) score was 3/4 and animal fluency was 11/m at the time. He was smoking at the time and was encouraged to quit smoking. I suggested we start him on low-dose Aricept, 5 mg strength generic once daily. I suggested we proceed with a head CT without contrast. He had this on 04/07/2015: IMPRESSION:   Abnormal CT head (without) demonstrating: 1. Mild diffuse and moderate perisylvian atrophy.   2. Mild-moderate periventricular and subcortical chronic small vessel ischemic disease.   3. No acute findings.  In addition, I personally reviewed the images through the PACS system.  Today, 04/27/2015: He reports doing about the same, tolerating donepezil 5 mg daily. John Farmer reports his mother had AD. She lived to be to her late 46s or early 98s. He fell a couple of times, per daughter. Last fall on garage steps a month ago, per John Farmer. He is still driving. Daughter does not feel Comfortable with him driving. The last time she road with him was when she had a kidney stone and had to rely on him to take her to the doctor. She did not feel comfortable at the time. He has fallen more than once  recently. He does not drink enough water. He likes to drink coffee several cups per day and Dr. Malachi Bonds through the day, very little water. He lives with John Farmer. Her other 2 siblings are not involved. She works long hours as a Passenger transport manager. She may need FMLA paperwork to help come to appointments with him. She says that he has made unreasonable financial decisions lately. for example, he recently purchased a car for $29,000 and she had to take the car back. 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 He still smokes and is not motivated to quit. As I understand, he did not actually have a driving evaluation done.    Previously:  02/16/2015: He reports memory loss for the past months or perhaps longer. He reports that his daughter pushed him for this appointment. She has been worried about his memory. He has noticed some short-term memory issues including forgetfulness and misplacing things. He has not noticed much in the way of hearing issues but he clearly has trouble hearing. He then adds that he does not wish to have any hearing aids. He denies any confusion, disorientation, delusions or hallucinations. He still drives. He reports that his daughter wanted his driver's license taken away but he had testing done through the state and they did not take his license away. I'm not sure when this all happened. It sounds like he had a full driver's evaluation. He reports that he also had his eyes checked for that purpose. He does not drink water. He drinks coffee, 6-7  cups per day. He smokes cigarettes, 5-6 per day. He drinks alcohol but not daily. He says he ran out of [place] at the house.  I reviewed your office note from 11/20/2014.   He is followed by cardiology, and sees Dr. Burt Knack usually and Dr. Rayann Heman for his pacemaker checkups.   He had a brain MRI without contrast on 05/31/2005, and I reviewed the report: This MRI scan of the brain shows only mild degree of periventricular and subcortical white  matter hyperintensities and mild degree of generalized cerebral atrophy. No structural lesion, tumors or infarction are identified.  He lives with his daughter, John Farmer (middle child), for the past 14 years. He has a total of 2 daughters and one son (whom he has "disinherited"). He does not see his other daughter very much.  All children are from his 1st wife, has been divorced x 3, but is on good terms with the 3rd wife, talks to her on the phone regularly.   Patient's mother lived to be nearly 14 yo and had memory loss in her later years (she "dipped snuff"). His father passed away at 55 yo (he smoked).   He denies any recent falls. He does not use a walking aid and states that he paces himself. He does not exercise regularly.  His Past Medical History Is Significant For: Past Medical History  Diagnosis Date  . HTN (hypertension)   . ED (erectile dysfunction)   . CAD (coronary artery disease)   . Tobacco abuse   . 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 HLD (hyperlipidemia)   . Complete heart block ([place]) 12/05/07    s/p PPM  . Moderate asthma   . Tobacco abuse     His Past Surgical History Is Significant For: Past Surgical History  Procedure Laterality Date  . Lumbar hnp    . Tonsillectomy    . Pacemaker insertion  12/05/07    by JA    His Family History Is Significant For: Family History  Problem Relation Age of Onset  . Coronary artery disease Other     male 1st degree relative <60  . Skin cancer Other   . Stroke Other     F  1st degree relative <60; M 1st degree relative <50  . Thyroid disease Other   . Heart attack Neg Hx   . Hypertension Neg Hx   . Dementia Mother     His Social History Is Significant For: Social History   Social History  . Marital Status: Single    Spouse Name: N/A  . Number of Children: 3  . Years of Education: N/A   Occupational History  . Insurance       Semi-retired   Social History Main Topics  . Smoking status: Current Every Day Smoker  . Smokeless tobacco: None     Comment: Smoked 1 ppd for more than 60 years, very clear that he will not quit.  . Alcohol Use: 0.0 oz/week    0 Standard drinks or equivalent per week     Comment: scotch  . Drug Use: No  . Sexual Activity: Not Asked   Other Topics Concern  . None   Social History Narrative   Lives in [place]. 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    Drinks 4-5 cups of coffee daily     His Allergies Are:  Allergies  Allergen Reactions  . Penicillins  REACTION: Severe Hives  :   His Current Medications Are:  Outpatient Encounter Prescriptions as of 04/27/2015  Medication Sig  . amLODipine (NORVASC) 10 MG tablet Take 1 tablet (10 mg total) by mouth daily.  Marland Kitchen aspirin 81 MG tablet Take 81 mg by mouth daily.    Marland Kitchen Bioflavonoid Products (BIOFLEX PO) Take by mouth.  . donepezil (ARICEPT) 5 MG tablet Take 1 tablet (5 mg total) by mouth at bedtime.  Marland Kitchen FLUZONE HIGH-DOSE 0.5 ML SUSY inject 0.5 milliliter intramuscularly  . folic acid (FOLVITE) 1 MG tablet Take 1 mg by mouth daily.  . Glucosamine-Chondroitin (OSTEO BI-FLEX REGULAR STRENGTH PO) Take 1 tablet by mouth daily.   . naproxen sodium (ANAPROX) 220 MG tablet Take 220 mg by mouth daily.  . Omega-3 Fatty Acids (OMEGA 3 PO) Take by mouth.  Marland Kitchen OVER THE COUNTER MEDICATION Take 1-2 capsules by mouth daily. Omega XL  . simvastatin (ZOCOR) 20 MG tablet Take 1 tablet (20 mg total) by mouth daily at 6 PM.  . warfarin (COUMADIN) 5 MG tablet Take as directed by coumadin clinic (Patient taking differently: Take 2.5-5 mg by mouth See admin instructions. Take 1 tablet on Monday, Wednesday and Friday then take 1/2 tablet the other days)   No facility-administered encounter medications on file as of 04/27/2015.  :  Review of Systems:  Out of a complete 14 point review of systems, all are reviewed and negative with the exception of these  symptoms as listed below:   Review of Systems  Neurological:       Patient has had recent MRI. States that he tolerates Donepezil. Daughter reports that patient has done things recently like buying $20,000 car.     Objective:  Neurologic Exam  Physical Exam Physical Examination:   Filed Vitals:   04/27/15 1523  BP: 128/60  Pulse: 72  Resp: 16   General Examination: The patient is a very pleasant 82 y.o. male in no acute distress. He is calm and cooperative with the exam, but does get frustrated when we talk about his driving.    HEENT: Normocephalic, atraumatic, pupils are equal, round and reactive to light and accommodation. Funduscopic exam is normal with sharp disc margins noted. Extraocular tracking shows mild saccadic breakdown without nystagmus noted. Hearing is impaired. Face is symmetric with no facial masking and normal facial sensation. There is no lip, neck or jaw tremor. Neck is not rigid with intact passive ROM. There are no carotid bruits on auscultation. Oropharynx exam reveals severe mouth dryness. No significant airway crowding is noted. Mallampati is class II. Tongue protrudes centrally and palate elevates symmetrically.    Chest: is clear to auscultation without wheezing, rhonchi or crackles noted.  Heart: sounds are regular and he has a prominent pansystolic murmur.    Abdomen: is soft, non-tender and non-distended with normal bowel sounds appreciated on auscultation.  Extremities: There is no pitting edema in the distal lower extremities bilaterally. Pedal pulses are intact.   Skin: is warm but very dry and flaky. He has multiple bruises across his forearms, especially over the dorsum of both hands. Of note, he is on Coumadin.   Musculoskeletal: exam reveals no obvious joint deformities, tenderness or joint swelling or erythema. 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   Neurologically:  Mental status: The patient is awake and alert,  paying good  attention. He is able to provide the history.  He is oriented to: person, place, time/date, day of week, month  of year and year. His memory, attention, language and knowledge are impaired mildly. There is no aphasia, agnosia, apraxia or anomia. There is a no significant bradyphrenia. Speech is not hypophonic with no dysarthria noted. Mood is congruent and affect is normal.   On 02/16/2015: His MMSE (Mini-Mental state exam) score is 25/30. CDT (Clock Drawing Test) score is 3/4. AFT (Animal Fluency Test) score is 11.   Cranial nerves are as described above under HEENT exam. In addition, shoulder shrug is normal with equal shoulder height noted.  Motor exam: Normal bulk, and strength for age is noted. Tone is not rigid with absence of cogwheeling. There is overall no bradykinesia. There is no drift or rebound. There is no tremor.  Romberg is negative, except for mild swaying. Reflexes are 1+ in the upper extremities and 1+ in the lower extremities with absence of ankle jerks. Fine motor skills are generally speaking mildly impaired throughout. Cerebellar testing shows no dysmetria or intention tremor on finger to nose testing. Heel to shin is unremarkable. There is no truncal or gait ataxia.   Sensory exam is intact to light touch in the upper  extremities with decrease in vibration sense in the distal lower extremities and mild decrease in temperature sense in the distal lower extremities.   Gait, station and balance: He stands up from the seated position with no significant difficulty and stands slightly wide-based. Posture is age-appropriate. He walks with preserved arm swing bilaterally. Tandem walk is not possible for him without help or holding on.    Assessment and Plan:   In summary, John Farmer is a very pleasant 82 year old male with a complex medical history of peripheral artery disease, hypertension, coronary artery disease, hyperlipidemia, complete heart block, status post  pacemaker placement in 2009, asthma, A. fib, aortic stenosis, abdominal aortic aneurysm, status post heart valve replacement, and long-standing history of smoking, who  presented presents for follow-up consultation of his dementia. He has recently had some report of behavioral changes. He may have made some unwise financial decisions recently. This is per daughter who was not therefore the initial appointment in December 2016. She also reports that he may not be safe to drive. I would like for him to be properly evaluated with a driving test through the [facility]. His primary care physician has initiated some paperwork as I understand for driving. I would like for him to request formal driving evaluation. We will repeat memory scores next time. He has been able to tolerate donepezil 5 mg once daily. We will probably be able to increase it next time, nevertheless, I do think we need more information and I suggested we proceed with formal neuropsychological evaluation, in particular, keeping in mind that there may be some improper financial decisions lately. I'm not sure that he is capable or safe to work at this time. 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 I would like to request input from neuropsychology and made a referral. He is somewhat resistant to this. I have specifically also requested that his daughter be available for his appointments from now on. I would be happy to sign FMLA paperwork so she can be therefore appointments. I have asked him to reduce his coffee and soda intake and increase his water intake. I've asked him to stop smoking. I would like to see if his daughter can help him with financial decisions and  help with paying his bills on time and so forth. I have previously recommended  that his thyroid function, B12 level, and routine blood work be checked with his primary care physician. He is at fall risk. He is advised to stay well-hydrated. I would like to see him back in 3  months, sooner if needed. I answered all the questions today to the best of my abilities. I also briefly talked to the daughter alone after the appointment was concluded. She was tearful. She stated that it is difficult for her to manage him at home and she would appreciate any further advice. Per daughter, he can be very difficult to deal with, tends to take of frustration on her, can be quite verbally strong and his language. We may have to involve psychology or geriatric psychiatry in the near future.  I spent 25 minutes in total face-to-face time with the patient, more than 50% of which was spent in counseling and coordination of care, reviewing test results, reviewing medication and discussing or reviewing the diagnosis of dementia, its prognosis and treatment options.

## 2015-05-17 ENCOUNTER — Encounter: Payer: Self-pay | Admitting: Internal Medicine

## 2015-05-17 ENCOUNTER — Ambulatory Visit (INDEPENDENT_AMBULATORY_CARE_PROVIDER_SITE_OTHER): Payer: Medicare Other | Admitting: *Deleted

## 2015-05-17 DIAGNOSIS — I480 Paroxysmal atrial fibrillation: Secondary | ICD-10-CM

## 2015-05-17 DIAGNOSIS — I442 Atrioventricular block, complete: Secondary | ICD-10-CM

## 2015-05-17 LAB — CUP PACEART INCLINIC DEVICE CHECK
Battery Impedance: 1800 Ohm
Implantable Lead Implant Date: 20091009
Implantable Lead Location: 753859
Implantable Lead Location: 753860
Lead Channel Impedance Value: 419 Ohm
Lead Channel Pacing Threshold Amplitude: 1 V
Lead Channel Sensing Intrinsic Amplitude: 1.5 mV
Lead Channel Sensing Intrinsic Amplitude: 12 mV
Lead Channel Setting Pacing Amplitude: 2 V
Lead Channel Setting Pacing Pulse Width: 0.4 ms
Lead Channel Setting Sensing Sensitivity: 2 mV
MDC IDC LEAD IMPLANT DT: 20091009
MDC IDC MSMT BATTERY VOLTAGE: 2.75 V
MDC IDC MSMT LEADCHNL RA IMPEDANCE VALUE: 405 Ohm
MDC IDC MSMT LEADCHNL RA PACING THRESHOLD AMPLITUDE: 0.375 V
MDC IDC MSMT LEADCHNL RA PACING THRESHOLD PULSEWIDTH: 0.4 ms
MDC IDC MSMT LEADCHNL RV PACING THRESHOLD PULSEWIDTH: 0.4 ms
MDC IDC SESS DTM: 20170320110856
MDC IDC STAT BRADY RA PERCENT PACED: 15 %
MDC IDC STAT BRADY RV PERCENT PACED: 99 %
Pulse Gen Serial Number: 2205492

## 2015-05-17 NOTE — Progress Notes (Signed)
Pacemaker check in clinic. Pacemaker site is stable without dehiscence, redness, edema, or ecchymosis. Normal device function. Thresholds, sensing, impedances consistent with previous measurements. Device programmed to maximize longevity. <1% mode switch burden + warfarin. No high ventricular rates noted. Device programmed at appropriate safety margins. Histogram distribution appropriate for patient activity level. Device programmed to optimize intrinsic conduction. Estimated longevity 4.5-6.25 years. Patient will follow up with AS in 6 months.

## 2015-06-17 ENCOUNTER — Ambulatory Visit: Payer: Medicare Other | Admitting: Neurology

## 2015-07-29 ENCOUNTER — Telehealth: Payer: Self-pay | Admitting: Cardiovascular Disease

## 2015-07-29 ENCOUNTER — Ambulatory Visit: Payer: Medicare Other | Admitting: Neurology

## 2015-07-29 NOTE — Telephone Encounter (Signed)
Patient aware DMV form ready for pick up.

## 2015-07-29 NOTE — Telephone Encounter (Signed)
DMV form picked up by patient

## 2015-08-09 ENCOUNTER — Ambulatory Visit: Payer: Medicare Other | Admitting: Psychology

## 2015-08-12 ENCOUNTER — Ambulatory Visit (HOSPITAL_COMMUNITY): Payer: Medicare Other | Attending: Cardiovascular Disease

## 2015-08-12 ENCOUNTER — Other Ambulatory Visit: Payer: Self-pay

## 2015-08-12 ENCOUNTER — Ambulatory Visit (INDEPENDENT_AMBULATORY_CARE_PROVIDER_SITE_OTHER): Payer: Medicare Other | Admitting: Cardiovascular Disease

## 2015-08-12 ENCOUNTER — Encounter: Payer: Self-pay | Admitting: Cardiovascular Disease

## 2015-08-12 VITALS — BP 120/60 | HR 64 | Ht 67.0 in | Wt 145.0 lb

## 2015-08-12 DIAGNOSIS — E785 Hyperlipidemia, unspecified: Secondary | ICD-10-CM

## 2015-08-12 DIAGNOSIS — I340 Nonrheumatic mitral (valve) insufficiency: Secondary | ICD-10-CM | POA: Diagnosis not present

## 2015-08-12 DIAGNOSIS — I352 Nonrheumatic aortic (valve) stenosis with insufficiency: Secondary | ICD-10-CM | POA: Insufficient documentation

## 2015-08-12 DIAGNOSIS — I517 Cardiomegaly: Secondary | ICD-10-CM | POA: Insufficient documentation

## 2015-08-12 DIAGNOSIS — I350 Nonrheumatic aortic (valve) stenosis: Secondary | ICD-10-CM

## 2015-08-12 DIAGNOSIS — R0989 Other specified symptoms and signs involving the circulatory and respiratory systems: Secondary | ICD-10-CM | POA: Diagnosis not present

## 2015-08-12 DIAGNOSIS — I359 Nonrheumatic aortic valve disorder, unspecified: Secondary | ICD-10-CM | POA: Diagnosis not present

## 2015-08-12 DIAGNOSIS — I714 Abdominal aortic aneurysm, without rupture, unspecified: Secondary | ICD-10-CM

## 2015-08-12 LAB — ECHOCARDIOGRAM COMPLETE
AOPV: 0.23 m/s
AV Mean grad: 39 mmHg
AV Peak grad: 57 mmHg
AV peak Index: 0.45
AV pk vel: 377 cm/s
AV vel: 0.86
AVAREAMEANV: 0.77 cm2
AVAREAMEANVIN: 0.42 cm2/m2
AVAREAVTI: 0.81 cm2
AVAREAVTIIND: 0.47 cm2/m2
AVCELMEANRAT: 0.22
CHL CUP MV DEC (S): 320
CHL CUP TV REG PEAK VELOCITY: 267 cm/s
DOP CAL AO MEAN VELOCITY: 292 cm/s
E/e' ratio: 16.51
EWDT: 320 ms
FS: 26 % — AB (ref 28–44)
IV/PV OW: 0.82
LA diam end sys: 36 mm
LA vol A4C: 32 ml
LA vol: 46 mL
LADIAMINDEX: 1.98 cm/m2
LASIZE: 36 mm
LAVOLIN: 25.3 mL/m2
LDCA: 3.46 cm2
LV E/e' medial: 16.51
LV E/e'average: 16.51
LV PW d: 14.1 mm — AB (ref 0.6–1.1)
LV TDI E'LATERAL: 3.95
LV e' LATERAL: 3.95 cm/s
LVOT VTI: 24.2 cm
LVOTD: 21 mm
LVOTPV: 88 cm/s
LVOTSV: 84 mL
LVOTVTI: 0.25 cm
MV pk A vel: 113 m/s
MVPKEVEL: 65.2 m/s
P 1/2 time: 525 ms
TDI e' medial: 6.8
TRMAXVEL: 267 cm/s
VTI: 97.8 cm
Valve area index: 0.47
Valve area: 0.86 cm2

## 2015-08-12 NOTE — Progress Notes (Signed)
Cardiology Office Note Date:  08/12/2015   ID:  John Farmer, DOB 05-06-1930, MRN [id number]  PCP:  Delorse LekBURNETT,BRENT A, MD  Cardiologist:  Tonny Bollmanooper, Riniyah Speich, MD    Chief Complaint  Patient presents with  . Coronary Artery Disease  . Hypertension     History of Present Illness: John NearingDonald E Babinski is a 82 y.o. male who presents for follow-up of severe aortic stenosis.   The patient has been followed for several years with aortic stenosis. He has been managed conservatively because of lack of symptoms. He also has a history of paroxysmal atrial fibrillation and complete heart block status post pacemaker implantation.  He notes that his INRs have been well-controlled. He denies any bleeding problems. He complains of gait unsteadiness and he walks with a cane. He has not had any falls. He specifically denies chest pain, chest pressure, shortness of breath, lightheadedness, or presyncope. He feels like he is getting along just fine. He continues to smoke one half pack of cigarettes every day.  Past Medical History  Diagnosis Date  . HTN (hypertension)   . ED (erectile dysfunction)   . CAD (coronary artery disease)   . Tobacco abuse   . 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 HLD (hyperlipidemia)   . Complete heart block (HCC) 12/05/07    s/p PPM  . Moderate asthma   . Tobacco abuse     Past Surgical History  Procedure Laterality Date  . Lumbar hnp    . Tonsillectomy    . Pacemaker insertion  12/05/07    by Fawn KirkJA    Current Outpatient Prescriptions  Medication Sig Dispense Refill  . amLODipine (NORVASC) 10 MG tablet Take 1 tablet (10 mg total) by mouth daily. 90 tablet 3  . aspirin 81 MG tablet Take 81 mg by mouth daily.      Marland Kitchen. Bioflavonoid Products (BIOFLEX PO) Take 1 Dose by mouth daily.     Marland Kitchen. donepezil (ARICEPT) 5 MG tablet Take 1 tablet (5 mg total) by mouth at bedtime. 90 tablet 3  . folic acid  (FOLVITE) 1 MG tablet Take 1 mg by mouth daily.    . Glucosamine-Chondroitin (OSTEO BI-FLEX REGULAR STRENGTH PO) Take 1 tablet by mouth daily.     . naproxen sodium (ANAPROX) 220 MG tablet Take 220 mg by mouth daily.    . Omega-3 Fatty Acids (OMEGA 3 PO) Take 1 Dose by mouth daily. Reported on 05/17/2015    . OVER THE COUNTER MEDICATION Take 1-2 capsules by mouth daily. Omega XL    . simvastatin (ZOCOR) 20 MG tablet Take 1 tablet (20 mg total) by mouth daily at 6 PM. 90 tablet 3  . warfarin (COUMADIN) 5 MG tablet Take as directed by coumadin clinic (Patient taking differently: Take 2.5-5 mg by mouth See admin instructions. Take 1 tablet on Monday, Wednesday and Friday then take 1/2 tablet the other days) 30 tablet 3   No current facility-administered medications for this visit.    Allergies:   Erythromycin base and Penicillins   Social History:  The patient  reports that he has been smoking.  He does not have any smokeless tobacco history on file. He reports that he drinks alcohol. He reports that he does not use illicit drugs.   Family History:  The patient's  family history includes Coronary artery disease in his other; Dementia in his mother; Skin cancer in his other; Stroke  in his other; Thyroid disease in his other. There is no history of Heart attack or Hypertension.    ROS:  Please see the history of present illness.  All other systems are reviewed and negative.    PHYSICAL EXAM: VS:  BP 120/60 mmHg  Pulse 64  Ht  (1.702 m)  Wt 145 lb (65.772 kg)  BMI 22.71 kg/m2 , BMI Body mass index is 22.71 kg/(m^2). GEN: Well nourished, well developed, pleasant elderly male in no acute distress HEENT: normal Neck: no JVD, no masses. Bilateral carotid bruits Cardiac: RRR with 3/6 harsh late peaking systolic crescendo decrescendo murmur heard throughout, no diastolic murmur               Respiratory:  clear to auscultation bilaterally, normal work of breathing GI: soft, nontender,  nondistended, + BS MS: no deformity or atrophy Ext: no pretibial edema, pedal pulses 2+= bilaterally Skin: warm and dry, no rash Neuro:  Strength and sensation are intact Psych: euthymic mood, full affect  EKG:  EKG is ordered today. The ekg ordered today shows electronic ventricular pacemaker 65 bpm  Recent Labs: No results found for requested labs within last 365 days.   Lipid Panel     Component Value Date/Time   CHOL 204* 08/16/2009 0000   TRIG 84.0 08/16/2009 0000   HDL 39.00* 08/16/2009 0000   CHOLHDL 5 08/16/2009 0000   VLDL 16.8 08/16/2009 0000   LDLCALC 88 11/27/2008 0000   LDLDIRECT 160.1 08/16/2009 0000      Wt Readings from Last 3 Encounters:  08/12/15 145 lb (65.772 kg)  04/27/15 156 lb (70.761 kg)  02/16/15 155 lb (70.308 kg)     Cardiac Studies Reviewed: 2-D echocardiogram is reviewed. Formal interpretation is pending. LV function appears in the low-normal range. Transaortic peak and mean gradients are 60 and 42 mmHg respectively. Calculated aortic valve area is a proximally 0.7 cm. There is moderate aortic insufficiency.  ASSESSMENT AND PLAN: 1.  Severe, stage CI, asymptomatic aortic stenosis: The patient has NYHA functional class I symptoms. He seems to be doing fine at his current activity level without symptoms of chest pain, shortness of breath, lightheadedness, or syncope. He clearly has severe aortic stenosis with a heavily calcified and restricted aortic valve on my evaluation of his echo. Transvalvular gradients are also in the severe range. I do not appreciate any significant difference from last year's study. Will await formal echo interpretation but I would not anticipate him requiring intervention at this time because of his asymptomatic status. I will see him back in 6 months.  2. Essential hypertension: Blood pressure is well controlled on amlodipine.  3. Paroxysmal atrial fibrillation: Treated with warfarin. No bleeding problems.  4.  Hyperlipidemia: Patient is treated with simvastatin. Labs are followed by his primary physician.  5. Abdominal aortic aneurysm: He will be due for follow-up duplex scan this fall. He understands the impact of tobacco abuse.  6. Bilateral carotid bruits: Will check a carotid duplex when he returns for his abdominal ultrasound.  7. Tobacco abuse: no interest in quitting. Counseling done.  Current medicines are reviewed with the patient today.  The patient does not have concerns regarding medicines.  Labs/ tests ordered today include:   Orders Placed This Encounter  Procedures  . EKG 12-Lead    Disposition:   FU 6 months with office visit, plan echo in one year.  Enzo Bi, MD  08/12/2015 1:21 PM     [facility]  [address] [place], [place]  [address] Phone: [phone]; Fax: [phone]

## 2015-08-12 NOTE — Patient Instructions (Addendum)
Medication Instructions:  Your physician recommends that you continue on your current medications as directed. Please refer to the Current Medication list given to you today.  Labwork: No new orders.   Testing/Procedures: Your physician has requested that you have an abdominal aorta duplex in October. During this test, an ultrasound is used to evaluate the aorta. Allow 30 minutes for this exam. Do not eat after midnight the day before and avoid carbonated beverages.  Your physician has requested that you have a carotid duplex in October. This test is an ultrasound of the carotid arteries in your neck. It looks at blood flow through these arteries that supply the brain with blood. Allow one hour for this exam. There are no restrictions or special instructions.  Follow-Up: Your physician wants you to follow-up in: 6 MONTHS with Dr Excell Seltzerooper.  You will receive a reminder letter in the mail two months in advance. If you don't receive a letter, please call our office to schedule the follow-up appointment.   Any Other Special Instructions Will Be Listed Below (If Applicable).     If you need a refill on your cardiac medications before your next appointment, please call your pharmacy.

## 2015-08-18 ENCOUNTER — Ambulatory Visit: Payer: Medicare Other | Admitting: Neurology

## 2015-09-13 ENCOUNTER — Encounter: Payer: Self-pay | Admitting: Nurse Practitioner

## 2015-10-15 ENCOUNTER — Encounter: Payer: Self-pay | Admitting: Nurse Practitioner

## 2015-11-22 ENCOUNTER — Encounter: Payer: Medicare Other | Admitting: Nurse Practitioner

## 2015-11-22 NOTE — Progress Notes (Signed)
Electrophysiology Office Note Date: 11/23/2015  ID:  John Farmer, DOB [birth year], MRN [id number]  PCP: Delorse Lek, MD Primary Cardiologist: Excell Seltzer Electrophysiologist: Allred  CC: Pacemaker follow-up  John Farmer is a 82 y.o. male seen today for Dr Johney Frame.  He presents today for routine electrophysiology followup.  Since last being seen in our clinic, the patient reports doing relatively well.  He denies chest pain, palpitations, dyspnea, PND, orthopnea, nausea, vomiting, dizziness, syncope, edema, weight gain, or early satiety.  Device History: STJ dual cahmber PPM implanted 2009 for complete heart block   Past Medical History:  Diagnosis Date  . CAD (coronary artery disease)   . Complete heart block (HCC) 12/05/07   s/p PPM  . ED (erectile dysfunction)   . HLD (hyperlipidemia)   . HTN (hypertension)   . Moderate asthma   . 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 Tobacco abuse   . Tobacco abuse    Past Surgical History:  Procedure Laterality Date  . lumbar hnp    . PACEMAKER INSERTION  12/05/07   by Fawn Kirk  . TONSILLECTOMY      Current Outpatient Prescriptions  Medication Sig Dispense Refill  . amLODipine (NORVASC) 10 MG tablet Take 1 tablet (10 mg total) by mouth daily. 90 tablet 3  . aspirin 81 MG tablet Take 81 mg by mouth daily.      Marland Kitchen Bioflavonoid Products (BIOFLEX PO) Take 1 Dose by mouth daily.     Marland Kitchen donepezil (ARICEPT) 5 MG tablet Take 1 tablet (5 mg total) by mouth at bedtime. 90 tablet 3  . folic acid (FOLVITE) 1 MG tablet Take 1 mg by mouth daily.    . Glucosamine-Chondroitin (OSTEO BI-FLEX REGULAR STRENGTH PO) Take 1 tablet by mouth daily.     . naproxen sodium (ANAPROX) 220 MG tablet Take 220 mg by mouth daily.    . Omega-3 Fatty Acids (OMEGA 3 PO) Take 1 Dose by mouth daily. Reported on 05/17/2015    . OVER THE COUNTER MEDICATION Take 1-2 capsules by mouth daily.  Omega XL    . simvastatin (ZOCOR) 20 MG tablet Take 1 tablet (20 mg total) by mouth daily at 6 PM. 90 tablet 3  . warfarin (COUMADIN) 5 MG tablet Take as directed by coumadin clinic (Patient taking differently: Take 2.5-5 mg by mouth See admin instructions. Take 1 tablet on Monday, Wednesday and Friday then take 1/2 tablet the other days) 30 tablet 3   No current facility-administered medications for this visit.     Allergies:   Erythromycin base and Penicillins   Social History: Social History   Social History  . Marital status: Single    Spouse name: N/A  . Number of children: 3  . Years of education: N/A   Occupational History  . Insurance      Semi-retired   Social History Main Topics  . Smoking status: Current Every Day Smoker  . Smokeless tobacco: Never Used     Comment: Smoked 1 ppd for more than 60 years, very clear that he will not quit.  . Alcohol use 0.0 oz/week     Comment: scotch  . Drug use: No  . Sexual activity: Not on file   Other Topics Concern  . Not on file   Social History Narrative   Lives in [place]. 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    Drinks 4-5 cups of coffee  daily     Family History: Family History  Problem Relation Age of Onset  . Dementia Mother   . Coronary artery disease Other     male 1st degree relative <60  . Skin cancer Other   . Stroke Other     F  1st degree relative <60; M 1st degree relative <50  . Thyroid disease Other   . Heart attack Neg Hx   . Hypertension Neg Hx    Review of Systems: All other systems reviewed and are otherwise negative except as noted above.   Physical Exam: VS:  BP 120/68   Pulse 84   Ht 5\' 7"  (1.702 m)   Wt 147 lb (66.7 kg)   SpO2 97%   BMI 23.02 kg/m  , BMI Body mass index is 23.02 kg/m.  GEN- The patient is elderly and chronically ill appearing, alert and oriented x 3 today, +HOH  HEENT: normocephalic, atraumatic; sclera clear, conjunctiva pink; hearing intact; oropharynx  clear; neck supple  Lungs- Clear to ausculation bilaterally, normal work of breathing.  No wheezes, rales, rhonchi Heart- Regular rate and rhythm (paced) GI- soft, non-tender, non-distended, bowel sounds present  Extremities- no clubbing, cyanosis, or edema  MS- no significant deformity or atrophy Skin- warm and dry, no rash or lesion; PPM pocket well healed Psych- euthymic mood, full affect Neuro- strength and sensation are intact  PPM Interrogation- reviewed in detail today,  See PACEART report  EKG:  EKG is not ordered today.  Recent Labs: No results found for requested labs within last 8760 hours.   Wt Readings from Last 3 Encounters:  11/23/15 147 lb (66.7 kg)  08/12/15 145 lb (65.8 kg)  04/27/15 156 lb (70.8 kg)     Other studies Reviewed: Additional studies/ records that were reviewed today include: Dr Excell Seltzerooper and Dr Jenel LucksAllred's office notes  Assessment and Plan:  1.  Complete heart block Normal PPM function See Pace Art report No changes today  2.  Paroxysmal atrial fibrillation Continue Warfarin for CHADS2VASC of 5 - followed by PCP CBC today  3.  Aortic stenosis Followed by Dr Excell Seltzerooper  4.  HTN Stable No change required today  5.  Tobacco abuse Remains uninterested in cessation Counseling given  6.  AAA/carotid stenosis Follow up study scheduled for next month   Current medicines are reviewed at length with the patient today.   The patient does not have concerns regarding his medicines.  The following changes were made today:  none  Labs/ tests ordered today include: CBC Orders Placed This Encounter  Procedures  . Implantable device check     Disposition:   Follow up with device clinic 6 months, Dr Johney FrameAllred 1 year      Signed, Gypsy BalsamAmber Erling Arrazola, NP 11/23/2015 10:23 AM  [facility] [address] [place] [address] [phone] (office) [phone] (fax)

## 2015-11-23 ENCOUNTER — Ambulatory Visit (INDEPENDENT_AMBULATORY_CARE_PROVIDER_SITE_OTHER): Payer: Medicare Other | Admitting: Nurse Practitioner

## 2015-11-23 ENCOUNTER — Encounter: Payer: Self-pay | Admitting: Nurse Practitioner

## 2015-11-23 ENCOUNTER — Encounter: Payer: Self-pay | Admitting: Internal Medicine

## 2015-11-23 VITALS — BP 120/68 | HR 84 | Ht 67.0 in | Wt 147.0 lb

## 2015-11-23 DIAGNOSIS — I10 Essential (primary) hypertension: Secondary | ICD-10-CM | POA: Diagnosis not present

## 2015-11-23 DIAGNOSIS — I442 Atrioventricular block, complete: Secondary | ICD-10-CM | POA: Diagnosis not present

## 2015-11-23 DIAGNOSIS — Z720 Tobacco use: Secondary | ICD-10-CM

## 2015-11-23 LAB — CUP PACEART INCLINIC DEVICE CHECK
Date Time Interrogation Session: 20170926101640
Implantable Lead Implant Date: 20091009
Implantable Lead Location: 753859
MDC IDC LEAD IMPLANT DT: 20091009
MDC IDC LEAD LOCATION: 753860
Pulse Gen Serial Number: 2205492

## 2015-11-23 NOTE — Patient Instructions (Addendum)
Medication Instructions:   Your physician recommends that you continue on your current medications as directed. Please refer to the Current Medication list given to you today.   If you need a refill on your cardiac medications before your next appointment, please call your pharmacy.  Labwork: NONE ORDER TODAY   Testing/Procedures: NONE ORDER TODAY    Follow-Up: Your physician wants you to follow-up in: ONE YEAR WITH ALLRED  You will receive a reminder letter in the mail two months in advance. If you don't receive a letter, please call our office to schedule the follow-up appointment.  Your physician wants you to follow-up in:  IN  6  MONTHS WITH DEVICE CLINICYou will receive a reminder letter in the mail two months in advance. If you don't receive a letter, please call our office to schedule the follow-up appointment.  '   Any Other Special Instructions Will Be Listed Below (If Applicable).

## 2015-11-24 ENCOUNTER — Encounter: Payer: Medicare Other | Admitting: Nurse Practitioner

## 2015-12-16 ENCOUNTER — Ambulatory Visit (HOSPITAL_COMMUNITY)
Admission: RE | Admit: 2015-12-16 | Discharge: 2015-12-16 | Disposition: A | Discharge status: Home / Self Care | Payer: Medicare Other | Source: Ambulatory Visit | Attending: Cardiovascular Disease | Admitting: Cardiovascular Disease

## 2015-12-16 DIAGNOSIS — E785 Hyperlipidemia, unspecified: Secondary | ICD-10-CM | POA: Diagnosis not present

## 2015-12-16 DIAGNOSIS — I739 Peripheral vascular disease, unspecified: Secondary | ICD-10-CM | POA: Insufficient documentation

## 2015-12-16 DIAGNOSIS — I700 Atherosclerosis of aorta: Secondary | ICD-10-CM | POA: Insufficient documentation

## 2015-12-16 DIAGNOSIS — R0989 Other specified symptoms and signs involving the circulatory and respiratory systems: Secondary | ICD-10-CM | POA: Insufficient documentation

## 2015-12-16 DIAGNOSIS — I714 Abdominal aortic aneurysm, without rupture, unspecified: Secondary | ICD-10-CM

## 2015-12-16 DIAGNOSIS — I10 Essential (primary) hypertension: Secondary | ICD-10-CM | POA: Insufficient documentation

## 2015-12-16 DIAGNOSIS — I70203 Unspecified atherosclerosis of native arteries of extremities, bilateral legs: Secondary | ICD-10-CM | POA: Diagnosis not present

## 2015-12-16 DIAGNOSIS — I6523 Occlusion and stenosis of bilateral carotid arteries: Secondary | ICD-10-CM | POA: Diagnosis not present

## 2015-12-16 DIAGNOSIS — Z8673 Personal history of transient ischemic attack (TIA), and cerebral infarction without residual deficits: Secondary | ICD-10-CM | POA: Diagnosis not present

## 2015-12-16 DIAGNOSIS — I2510 Atherosclerotic heart disease of native coronary artery without angina pectoris: Secondary | ICD-10-CM | POA: Insufficient documentation

## 2016-03-20 ENCOUNTER — Encounter: Payer: Self-pay | Admitting: Cardiovascular Disease

## 2016-03-20 ENCOUNTER — Ambulatory Visit (INDEPENDENT_AMBULATORY_CARE_PROVIDER_SITE_OTHER): Payer: Medicare Other | Admitting: Cardiovascular Disease

## 2016-03-20 VITALS — BP 126/60 | HR 68 | Ht 66.5 in | Wt 144.2 lb

## 2016-03-20 DIAGNOSIS — I350 Nonrheumatic aortic (valve) stenosis: Secondary | ICD-10-CM | POA: Diagnosis not present

## 2016-03-20 DIAGNOSIS — I359 Nonrheumatic aortic valve disorder, unspecified: Secondary | ICD-10-CM

## 2016-03-20 NOTE — Progress Notes (Signed)
Cardiology Office Note Date:  03/20/2016   ID:  John Farmer, DOB [birth year], MRN [id number]  PCP:  Delorse Lek, MD  Cardiologist:  Tonny Bollman, MD    Chief Complaint  Patient presents with  . Coronary Artery Disease    follow up     History of Present Illness: John Farmer is a 83 y.o. male who presents for follow-up of severe aortic stenosis.   The patient has been followed for several years with aortic stenosis. He has been managed conservatively because of lack of symptoms. He also has a history of paroxysmal atrial fibrillation and complete heart block status post pacemaker implantation. The patient is anticoagulated with warfarin. Other cardiovascular problems include hypertension and peripheral arterial disease with severe iliac stenosis and abdominal aortic aneurysm. The patient is a lifelong smoker.  The patient is here alone today.  He feels well. He was out shoveling so earlier this week and denies any exertional symptoms. He specifically denies chest pain, chest pressure, shortness of breath, lightheadedness, or syncope. He denies any leg swelling, orthopnea, or PND. He is compliant with all of his medications. States that his INR has been well controlled of late.   Past Medical History:  Diagnosis Date  . CAD (coronary artery disease)   . Complete heart block (HCC) 12/05/07   s/p PPM  . ED (erectile dysfunction)   . HLD (hyperlipidemia)   . HTN (hypertension)   . Moderate asthma   . 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 Tobacco abuse   . Tobacco abuse     Past Surgical History:  Procedure Laterality Date  . lumbar hnp    . PACEMAKER INSERTION  12/05/07   by Fawn Kirk  . TONSILLECTOMY      Current Outpatient Prescriptions  Medication Sig Dispense Refill  . amLODipine (NORVASC) 10 MG tablet Take 1 tablet (10 mg total) by mouth daily. 90 tablet 3  . aspirin 81 MG tablet Take  81 mg by mouth daily.      Marland Kitchen Bioflavonoid Products (BIOFLEX PO) Take 1 Dose by mouth daily.     Marland Kitchen donepezil (ARICEPT) 5 MG tablet Take 1 tablet (5 mg total) by mouth at bedtime. 90 tablet 3  . folic acid (FOLVITE) 1 MG tablet Take 1 mg by mouth daily.    . Glucosamine-Chondroitin (OSTEO BI-FLEX REGULAR STRENGTH PO) Take 1 tablet by mouth daily.     . naproxen sodium (ANAPROX) 220 MG tablet Take 220 mg by mouth daily.    . Omega-3 Fatty Acids (OMEGA 3 PO) Take 1 Dose by mouth daily. Reported on 05/17/2015    . OVER THE COUNTER MEDICATION Take 1-2 capsules by mouth daily. Omega XL    . simvastatin (ZOCOR) 20 MG tablet Take 1 tablet (20 mg total) by mouth daily at 6 PM. 90 tablet 3  . warfarin (COUMADIN) 5 MG tablet Take as directed by coumadin clinic (Patient taking differently: Take 2.5-5 mg by mouth See admin instructions. Take 1 tablet on Monday, Wednesday and Friday then take 1/2 tablet the other days) 30 tablet 3   No current facility-administered medications for this visit.     Allergies:   Erythromycin base and Penicillins   Social History:  The patient  reports that he has been smoking.  He has never used smokeless tobacco. He reports that he drinks alcohol. He reports that he does not use drugs.   Family  History:  The patient's  family history includes Coronary artery disease in his other; Dementia in his mother; Skin cancer in his other; Stroke in his other; Thyroid disease in his other.    ROS:  Please see the history of present illness.  Otherwise, review of systems is positive for memory loss.  All other systems are reviewed and negative.    PHYSICAL EXAM: VS:  BP 126/60   Pulse 68   Ht 5' 6.5" (1.689 m)   Wt 144 lb 3.2 oz (65.4 kg)   BMI 22.93 kg/m  , BMI Body mass index is 22.93 kg/m. GEN: Well nourished, well developed, elderly male in no acute distress  HEENT: normal  Neck: no JVD, no masses. Bilateral carotid bruits Cardiac: RRR with grade 3/6 harsh late peaking  systolic murmur at the RUSB, no diastolic murmur, A2 absent               Respiratory:  clear to auscultation bilaterally, normal work of breathing GI: soft, nontender, nondistended, + BS MS: no deformity or atrophy  Ext: no pretibial edema Skin: warm and dry, no rash Neuro:  Strength and sensation are intact Psych: euthymic mood, full affect  EKG:  EKG is ordered today. The ekg ordered today shows atrial sensed, ventricular paced rhythm at 68 bpm  Recent Labs: No results found for requested labs within last 8760 hours.   Lipid Panel     Component Value Date/Time   CHOL 204 (H) 08/16/2009 0000   TRIG 84.0 08/16/2009 0000   HDL 39.00 (L) 08/16/2009 0000   CHOLHDL 5 08/16/2009 0000   VLDL 16.8 08/16/2009 0000   LDLCALC 88 11/27/2008 0000   LDLDIRECT 160.1 08/16/2009 0000      Wt Readings from Last 3 Encounters:  03/20/16 144 lb 3.2 oz (65.4 kg)  11/23/15 147 lb (66.7 kg)  08/12/15 145 lb (65.8 kg)     Cardiac Studies Reviewed: Carotid Duplex 12-16-2015: 40-59% LICA stenosis, <40% RICA stenosis  2D Echo 08-12-2015: Left ventricle:  The cavity size was normal. There was mild concentric hypertrophy. Systolic function was normal. The estimated ejection fraction was in the range of 55% to 60%.  Regional wall motion abnormalities:   Possible hypokinesis of the apicalanteroseptal myocardium. Abnormal relaxation with increased filling pressures.  ------------------------------------------------------------------- Aortic valve:   Probably trileaflet; severely thickened, moderately calcified leaflets.  Doppler:   There was moderate to severe stenosis.   There was moderate regurgitation.    VTI ratio of LVOT to aortic valve: 0.21. Valve area (VTI): 0.73 cm^2. Indexed valve area (VTI): 0.4 cm^2/m^2. Peak velocity ratio of LVOT to aortic valve: 0.21. Valve area (Vmax): 0.73 cm^2. Indexed valve area (Vmax): 0.4 cm^2/m^2. Mean velocity ratio of LVOT to aortic valve: 0.22. Valve  area (Vmean): 0.75 cm^2. Indexed valve area (Vmean): 0.41 cm^2/m^2.    Mean gradient (S): 39 mm Hg. Peak gradient (S): 64 mm Hg.  ------------------------------------------------------------------- Aorta:  Aortic root: The aortic root was normal in size. Ascending aorta: The ascending aorta was normal in size.  ------------------------------------------------------------------- Mitral valve:   Calcified annulus. Mildly thickened leaflets . Doppler:  There was mild regurgitation.  ------------------------------------------------------------------- Right ventricle:  The cavity size was normal. Systolic function was normal.  ------------------------------------------------------------------- Ventricular septum:   Septal motion showed paradox.  ------------------------------------------------------------------- Pulmonic valve:   Poorly visualized.  ------------------------------------------------------------------- Pulmonary artery:   Systolic pressure was at the upper limits of normal.  ------------------------------------------------------------------- Right atrium:  The atrium was normal in size.  ------------------------------------------------------------------- Systemic  veins: Inferior vena cava: The vessel was normal in size. The respirophasic diameter changes were in the normal range (= 50%), consistent with normal central venous pressure. Diameter: 12 mm.  Abdominal aortic ultrasound 12/16/2015: Infrarenal abdominal aortic aneurysm measures 2.6 x 3.4 cm in maximal dimension, stable from previous studies. There is severe bilateral iliac stenosis with peak velocities greater than 4 m/s.  ASSESSMENT AND PLAN: 1.  Severe, stage CI, aortic stenosis: The patient's physical exam is clearly consistent with severe aortic stenosis. It is remarkable that he has no symptoms whatsoever. I asked him to avoid strenuous activity such as shoveling snow. He should continue with his  regular activities. He is again counseled about the cardinal symptoms associated with severe aortic stenosis and will keep an eye out for these. As long as no symptoms arise I will plan to see him back in 6 months with an echocardiogram before that visit. His last echocardiogram is reviewed today.  2. Hypertension: Blood pressure is controlled on current treatment.  3. Abdominal aortic aneurysm: Stable dimensions based on most recent abdominal aortic duplex in October 2017. This study is reviewed today. Will repeat a scan in one year.  4. Carotid stenosis without history of stroke: Carotid duplex results outlined above. Continue with medical management  5. Tobacco abuse: The patient continues to smoke one half pack of cigarettes daily. He has no intention of quitting. Cessation counseling done.  6. Hyperlipidemia: The patient is treated with simvastatin 20 mg. He is compliant with treatment.  Current medicines are reviewed with the patient today.  The patient does not have concerns regarding medicines.  Labs/ tests ordered today include:   Orders Placed This Encounter  Procedures  . EKG 12-Lead  . ECHOCARDIOGRAM COMPLETE    Disposition:   FU 6 months  Signed, Tonny Bollman, MD  03/20/2016 2:05 PM    [facility] [address] [place], [place], [place]  [address] Phone: [phone]; Fax: [phone]

## 2016-03-20 NOTE — Patient Instructions (Signed)
Medication Instructions:  Your physician recommends that you continue on your current medications as directed. Please refer to the Current Medication list given to you today.  Labwork: No new orders.   Testing/Procedures: Your physician has requested that you have an echocardiogram in 6 MONTHS. Echocardiography is a painless test that uses sound waves to create images of your heart. It provides your doctor with information about the size and shape of your heart and how well your heart's chambers and valves are working. This procedure takes approximately one hour. There are no restrictions for this procedure.  Follow-Up: Your physician wants you to follow-up in: 6 MONTHS with Dr Cooper.  You will receive a reminder letter in the mail two months in advance. If you don't receive a letter, please call our office to schedule the follow-up appointment.   Any Other Special Instructions Will Be Listed Below (If Applicable).     If you need a refill on your cardiac medications before your next appointment, please call your pharmacy.   

## 2016-03-29 ENCOUNTER — Telehealth: Payer: Self-pay | Admitting: Cardiovascular Disease

## 2016-03-29 NOTE — Telephone Encounter (Signed)
Patients Daughter Dropped off a brown envelope on 03/28/16 which included Dept of Transportation papers. Gavin PoundDeborah ( daughter) called in today asking if these papers could get completed asap [facility] I made her aware I have papers and Lauren & Dr.Cooper both will be back In office Friday 03/31/16 she stated that will be ok.

## 2016-07-16 ENCOUNTER — Emergency Department (HOSPITAL_COMMUNITY): Payer: Medicare Other

## 2016-07-16 ENCOUNTER — Encounter (HOSPITAL_COMMUNITY): Payer: Self-pay | Admitting: Emergency Medicine

## 2016-07-16 ENCOUNTER — Observation Stay (HOSPITAL_COMMUNITY)
Admission: EM | Admit: 2016-07-16 | Discharge: 2016-07-18 | Disposition: A | Discharge status: Skilled Nursing Facility | Payer: Medicare Other | Attending: Internal Medicine | Admitting: Internal Medicine

## 2016-07-16 DIAGNOSIS — F0390 Unspecified dementia without behavioral disturbance: Secondary | ICD-10-CM | POA: Insufficient documentation

## 2016-07-16 DIAGNOSIS — M25551 Pain in right hip: Secondary | ICD-10-CM | POA: Insufficient documentation

## 2016-07-16 DIAGNOSIS — Z79899 Other long term (current) drug therapy: Secondary | ICD-10-CM | POA: Diagnosis not present

## 2016-07-16 DIAGNOSIS — R296 Repeated falls: Principal | ICD-10-CM | POA: Insufficient documentation

## 2016-07-16 DIAGNOSIS — Z7982 Long term (current) use of aspirin: Secondary | ICD-10-CM | POA: Diagnosis not present

## 2016-07-16 DIAGNOSIS — G459 Transient cerebral ischemic attack, unspecified: Secondary | ICD-10-CM | POA: Diagnosis not present

## 2016-07-16 DIAGNOSIS — Z9181 History of falling: Secondary | ICD-10-CM | POA: Diagnosis not present

## 2016-07-16 DIAGNOSIS — R112 Nausea with vomiting, unspecified: Secondary | ICD-10-CM | POA: Insufficient documentation

## 2016-07-16 DIAGNOSIS — Z7901 Long term (current) use of anticoagulants: Secondary | ICD-10-CM | POA: Insufficient documentation

## 2016-07-16 DIAGNOSIS — Z66 Do not resuscitate: Secondary | ICD-10-CM | POA: Diagnosis not present

## 2016-07-16 DIAGNOSIS — I2510 Atherosclerotic heart disease of native coronary artery without angina pectoris: Secondary | ICD-10-CM | POA: Insufficient documentation

## 2016-07-16 DIAGNOSIS — F17210 Nicotine dependence, cigarettes, uncomplicated: Secondary | ICD-10-CM | POA: Diagnosis not present

## 2016-07-16 DIAGNOSIS — I739 Peripheral vascular disease, unspecified: Secondary | ICD-10-CM | POA: Insufficient documentation

## 2016-07-16 DIAGNOSIS — R627 Adult failure to thrive: Secondary | ICD-10-CM | POA: Diagnosis not present

## 2016-07-16 DIAGNOSIS — Z950 Presence of cardiac pacemaker: Secondary | ICD-10-CM | POA: Insufficient documentation

## 2016-07-16 DIAGNOSIS — E785 Hyperlipidemia, unspecified: Secondary | ICD-10-CM | POA: Diagnosis not present

## 2016-07-16 DIAGNOSIS — I10 Essential (primary) hypertension: Secondary | ICD-10-CM | POA: Diagnosis not present

## 2016-07-16 DIAGNOSIS — E43 Unspecified severe protein-calorie malnutrition: Secondary | ICD-10-CM | POA: Insufficient documentation

## 2016-07-16 DIAGNOSIS — R5381 Other malaise: Secondary | ICD-10-CM | POA: Diagnosis not present

## 2016-07-16 DIAGNOSIS — Z6820 Body mass index (BMI) 20.0-20.9, adult: Secondary | ICD-10-CM | POA: Diagnosis not present

## 2016-07-16 DIAGNOSIS — F17200 Nicotine dependence, unspecified, uncomplicated: Secondary | ICD-10-CM | POA: Diagnosis present

## 2016-07-16 DIAGNOSIS — I714 Abdominal aortic aneurysm, without rupture: Secondary | ICD-10-CM | POA: Diagnosis not present

## 2016-07-16 DIAGNOSIS — I480 Paroxysmal atrial fibrillation: Secondary | ICD-10-CM | POA: Diagnosis not present

## 2016-07-16 DIAGNOSIS — W19XXXA Unspecified fall, initial encounter: Secondary | ICD-10-CM

## 2016-07-16 DIAGNOSIS — R634 Abnormal weight loss: Secondary | ICD-10-CM

## 2016-07-16 LAB — CBC WITH DIFFERENTIAL/PLATELET
BASOS ABS: 0 10*3/uL (ref 0.0–0.1)
Basophils Relative: 0 %
Eosinophils Absolute: 0 10*3/uL (ref 0.0–0.7)
Eosinophils Relative: 1 %
HEMATOCRIT: 40.8 % (ref 39.0–52.0)
Hemoglobin: 13.8 g/dL (ref 13.0–17.0)
LYMPHS PCT: 34 %
Lymphs Abs: 2.8 10*3/uL (ref 0.7–4.0)
MCH: 31.1 pg (ref 26.0–34.0)
MCHC: 33.8 g/dL (ref 30.0–36.0)
MCV: 91.9 fL (ref 78.0–100.0)
Monocytes Absolute: 0.7 10*3/uL (ref 0.1–1.0)
Monocytes Relative: 9 %
NEUTROS ABS: 4.5 10*3/uL (ref 1.7–7.7)
Neutrophils Relative %: 56 %
PLATELETS: 176 10*3/uL (ref 150–400)
RBC: 4.44 MIL/uL (ref 4.22–5.81)
RDW: 13.5 % (ref 11.5–15.5)
WBC: 8.1 10*3/uL (ref 4.0–10.5)

## 2016-07-16 LAB — URINALYSIS, ROUTINE W REFLEX MICROSCOPIC
Bilirubin Urine: NEGATIVE
Glucose, UA: NEGATIVE mg/dL
HGB URINE DIPSTICK: NEGATIVE
Ketones, ur: 5 mg/dL — AB
LEUKOCYTES UA: NEGATIVE
Nitrite: NEGATIVE
Protein, ur: NEGATIVE mg/dL
SPECIFIC GRAVITY, URINE: 1.019 (ref 1.005–1.030)
pH: 5 (ref 5.0–8.0)

## 2016-07-16 LAB — TROPONIN I
Troponin I: 0.03 ng/mL (ref <0.03)
Troponin I: <0.03 ng/mL (ref <0.03)

## 2016-07-16 LAB — I-STAT CHEM 8, ED
BUN: 21 mg/dL — ABNORMAL HIGH (ref 6–20)
Calcium, Ion: 1.1 mmol/L — ABNORMAL LOW (ref 1.15–1.40)
Chloride: 105 mmol/L (ref 101–111)
Creatinine, Ser: 1.1 mg/dL (ref 0.61–1.24)
GLUCOSE: 86 mg/dL (ref 65–99)
HCT: 41 % (ref 39.0–52.0)
Hemoglobin: 13.9 g/dL (ref 13.0–17.0)
POTASSIUM: 3.6 mmol/L (ref 3.5–5.1)
Sodium: 141 mmol/L (ref 135–145)
TCO2: 27 mmol/L (ref 0–100)

## 2016-07-16 LAB — PROTIME-INR
INR: 1.79
Prothrombin Time: 21.1 seconds — ABNORMAL HIGH (ref 11.4–15.2)

## 2016-07-16 LAB — TSH: TSH: 2.446 u[IU]/mL (ref 0.350–4.500)

## 2016-07-16 MED ORDER — WARFARIN SODIUM 2.5 MG PO TABS: 2.5000 mg | ORAL_TABLET | ORAL | Status: DC

## 2016-07-16 MED ORDER — NAPROXEN SODIUM 220 MG PO TABS: 220.0000 mg | ORAL_TABLET | Freq: Every day | ORAL | Status: DC

## 2016-07-16 MED ORDER — WARFARIN - PHYSICIAN DOSING INPATIENT: Freq: Every day | Status: DC

## 2016-07-16 MED ORDER — DONEPEZIL HCL 5 MG PO TABS
5.0000 mg | ORAL_TABLET | Freq: Every day | ORAL | Status: DC
  Administered 2016-07-17: 5 mg via ORAL
  Filled 2016-07-16: qty 1

## 2016-07-16 MED ORDER — NAPROXEN 250 MG PO TABS: 250.0000 mg | ORAL_TABLET | Freq: Every day | ORAL | Status: DC

## 2016-07-16 MED ORDER — FOLIC ACID 1 MG PO TABS
1.0000 mg | ORAL_TABLET | Freq: Every day | ORAL | Status: DC
  Administered 2016-07-17 – 2016-07-18 (×2): 1 mg via ORAL
  Filled 2016-07-16 (×2): qty 1

## 2016-07-16 MED ORDER — WARFARIN SODIUM 5 MG PO TABS
5.0000 mg | ORAL_TABLET | ORAL | Status: DC
  Administered 2016-07-17: 5 mg via ORAL
  Filled 2016-07-16: qty 1

## 2016-07-16 MED ORDER — ASPIRIN 81 MG PO CHEW
81.0000 mg | CHEWABLE_TABLET | Freq: Every day | ORAL | Status: DC
  Administered 2016-07-17 – 2016-07-18 (×2): 81 mg via ORAL
  Filled 2016-07-16 (×2): qty 1

## 2016-07-16 MED ORDER — SIMVASTATIN 20 MG PO TABS
20.0000 mg | ORAL_TABLET | Freq: Every day | ORAL | Status: DC
  Administered 2016-07-17: 20 mg via ORAL
  Filled 2016-07-16: qty 1

## 2016-07-16 MED ORDER — SODIUM CHLORIDE 0.9 % IV BOLUS (SEPSIS)
500.0000 mL | Freq: Once | INTRAVENOUS | Status: AC
  Administered 2016-07-16: 500 mL via INTRAVENOUS

## 2016-07-16 MED ORDER — AMLODIPINE BESYLATE 10 MG PO TABS
10.0000 mg | ORAL_TABLET | Freq: Every day | ORAL | Status: DC
  Administered 2016-07-17: 10 mg via ORAL
  Filled 2016-07-16: qty 1

## 2016-07-16 MED ORDER — SODIUM CHLORIDE 0.9% FLUSH
3.0000 mL | Freq: Two times a day (BID) | INTRAVENOUS | Status: DC
  Administered 2016-07-16 – 2016-07-18 (×4): 3 mL via INTRAVENOUS

## 2016-07-16 MED ORDER — SODIUM CHLORIDE 0.9 % IV SOLN: 250.0000 mL | INTRAVENOUS | Status: DC | PRN

## 2016-07-16 MED ORDER — SODIUM CHLORIDE 0.9% FLUSH: 3.0000 mL | INTRAVENOUS | Status: DC | PRN

## 2016-07-16 NOTE — H&P (Signed)
History and Physical   John Farmer ZOX:[id number] DOB: Jul 27, 1930 DOA: 07/16/2016  PCP: Delorse Lek, MD  Chief Complaint: Falls  HPI:  John Farmer is a 83 year old Caucasian male past medical history significant for coronary artery disease, complete heart block status post pacemaker, essential hypertension, peripheral arterial disease, dementia. Patient presented to the emergency department on 5/20 given concern for recurrent falls. Much the history is obtained by the daughter, as patient has baseline dementia at home. Per daughter, who is an employee at [employer] and works as a TEFL teacher, patient has had a functional decline with preceding 2-3 months. He has had more falls at home. Most recently, patient fell 1 day prior to admission and landed on his right hip. Unclear events surrounding this fall. No recent changes in medications. Daughter also reports patient has a poor appetite and has lost nearly 30 pounds of unintentional weight over the preceding 3 months time. Patient lives at home with his daughter. Patient has a visiting home health nurse several days per week. Daughter is interested in having the patient placed in a rehabilitation or nursing facility, as the burden of care is too great for her at this time. Daughter does not believe the patient has had presyncope or syncopal episode surrounding recurrent falls. He takes medication as prescribed. Does not take many sedating medications at the daughter is aware of. Daughter has power of attorney given the patient's dementia. No recent fever or chills. Patient has otherwise been in his normal state of health. Patient denies any chest pain or shortness of breath.  ED Course:  Patient was hemodynamically stable in the emergency department. Patient was afebrile, heart rate in the 60s, blood pressure mildly elevated at 144/62. Lab work shows BUNs and 21, creatinine 1.1. White blood cell count 8.1, hemoglobin 13.8. Troponin 0.03. EKG  shows paced rhythm. Chest x-ray negative for infection. CT head showed no acute intracranial abnormality. No fracture noted on x-ray of hip. She given 1 L bolus normal saline.  Review of Systems: A complete ROS was obtained; pertinent positives negatives are denoted in the HPI. Otherwise, all systems are negative.   Past Medical History:  Diagnosis Date  . CAD (coronary artery disease)   . Complete heart block (HCC) 12/05/07   s/p PPM  . ED (erectile dysfunction)   . HLD (hyperlipidemia)   . HTN (hypertension)   . Moderate asthma   . 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 Tobacco abuse   . Tobacco abuse    Social History   Social History  . Marital status: Single    Spouse name: N/A  . Number of children: 3  . Years of education: N/A   Occupational History  . Insurance      Semi-retired   Social History Main Topics  . Smoking status: Current Every Day Smoker  . Smokeless tobacco: Never Used     Comment: Smoked 1 ppd for more than 60 years, very clear that he will not quit.  . Alcohol use 0.0 oz/week     Comment: scotch  . Drug use: No  . Sexual activity: Not on file   Other Topics Concern  . Not on file   Social History Narrative   Lives in [place]. 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    Drinks 4-5 cups of coffee daily    Family History  Problem Relation Age of Onset  . Dementia  Mother   . Coronary artery disease Other        male 1st degree relative <60  . Skin cancer Other   . Stroke Other        F  1st degree relative <60; M 1st degree relative <50  . Thyroid disease Other   . Heart attack Neg Hx   . Hypertension Neg Hx     Physical Exam: Vitals:   07/16/16 1459 07/16/16 1550 07/16/16 1722 07/16/16 1932  BP:  (!) 144/62 (!) 145/72 (!) 153/62  Pulse:   62 60  Resp:   14 14  Temp:   97.5 F (36.4 C)   TempSrc:   Oral   SpO2:   100% 99%  Weight: 59 kg (130 lb)       Height: 5' 7.5" (1.715 m)      General: Appears calm and comfortable. Appears mildly disheveled. ENT: Grossly normal hearing, MMM. Cardiovascular: RRR. Respiratory: CTA bilaterally. No wheezes or crackles. Normal respiratory effort. Abdomen: Soft, non-tender. Bowel sounds present.  Skin: No rash or induration seen on limited exam. Dry skin noted on lower extremity. Band-Aid over left knee with small abrasions. Musculoskeletal: Grossly normal tone BUE/BLE. Appropriate ROM. No pain with palpation of right hip. Psychiatric: Grossly normal mood and affect. Neurologic: Patient is confused and unsure of events surrounding consultation. Easily directable. Forgetful of recent falls.  I have personally reviewed the following labs, culture data, and imaging studies.  Labs:  White blood cell count 8.1, he will 13.8. Renal function appears to be at baseline. Urinalysis negative for infection. Troponin mildly elevated at 0.03.  Radiology:  Right Hip X Ray (5/20) -No fracture  CXR (5/20) -WNL  CT Head (5/20)  -NAICA   Assessment/Plan: John Farmer is a 83 year old Caucasian male past medical history significant for coronary artery disease, complete heart block status post pacemaker, essential hypertension, peripheral arterial disease, dementia. Patient presented to the emergency department on 5/20 given concern for recurrent falls.  #1 Recurrent falls and deconditioning Patient has had a gradual functional decline over the preceding 3 months. This is likely a sequelae of patient's underlying dementia. No signs of infection currently. Imaging negative for infection such as pneumonia, or hip fracture. Daughter's primary reason for bringing to ED is finding placement such as skilled nursing facility or nursing home. No recent changes in medication to predispose to increased falls. Last TSH was checked in 2008. Plan is to admit under observation status to med-surge floor. Will require physical therapy  consultation in the morning for potential placement. We will recheck TSH. Patient has already received IV fluid for hydration in the emergency department. Will encourage strong oral intake. Avoid sedating medications given patient is at risk for delirium with underlying dementia. Will also check orthostatic vital signs to ensure this is not contributing to recurrent falls.   #2 Coronary artery disease No chest pain or shortness of breath. Troponin mildly elevated. Highly unlikely to represent ACS. Patient in paced rhythm based upon EKG, which I personally reviewed. Plan is to repeat troponin to ensure it is down trending. Will continue aspirin.   #3 Essential hypertension Patient is mildly hypertensive on arrival. Plan is to resume home blood pressure medications.  #4 Paroxysmal atrial fibrillation Patient takes warfarin for anticoagulation. INR mildly subtherapeutic. No signs or symptoms of bleeding. Plan is to check daily INR.   DVT prophylaxis: N/A, as patient takes warfarin Code Status: DNR per conversation with daughter at time of admission.  Disposition Plan:  Anticipate D/C home vs. SNF in 2 days Consults called: N/A Admission status: Observation- needs PT evaluation  Tyrone SageMatthew Mahesh Sizemore, MD [facility] [phone]  If 7PM-7AM, please contact night-coverage [email or web address] Password [email or web address]

## 2016-07-16 NOTE — ED Notes (Signed)
Patient stated that he felt fine during ambulation, but he did have some instances that he wobbled and was off balance and I had to re-stabilize patient.  His daughter states "that he is walking much worse today than yesterday".  Pt. Had no dizziness, but when he got back to bed, he stated he was a little SOB.

## 2016-07-16 NOTE — ED Triage Notes (Signed)
Daughter reports Pt lives with er and is unsafe at home because of frequent falls. Daughter reports she heard her father fall around 0100 .  Pt daughter wants to place Pt in an assisted living facility but has not been able to. Daughter reports Pt has lost 30lbs since Jan. 2018. Pt daughter tearful while talking about Pt. .Marland Kitchen

## 2016-07-16 NOTE — ED Triage Notes (Signed)
Pt here for fall this morning about 1 am. Pt c/o pain to right hip. Per family pt has history of falls.

## 2016-07-16 NOTE — ED Provider Notes (Signed)
[facility]-EMERGENCY DEPT Provider Note   CSN: [id number] Arrival date & time: 07/16/16  1445  By signing my name below, I, Karren Cobble, attest that this documentation has been prepared under the direction and in the presence of Benjiman Core, MD. Electronically Signed: Karren Cobble, ED Scribe. 07/16/16. 7:20 PM.   History   Chief Complaint Chief Complaint  Patient presents with  . Fall  . Hip Pain   The history is provided by the patient and a relative. No language interpreter was used.  Hip Pain     HPI Comments: John Farmer is a 83 y.o. male with a PMHx of CAD, HLD, HTN, and PVD, who presents to the Emergency Department complaining of right hip s/p fall this morning around 1 am. He has since been unstable while ambulating which is abnormal from his baseline. He normally ambulates with little assistance but has since required additional assistance. Per pt's daughter, pt was fully unable to recall the incident due to the time of when it happened. She notes a concern with his decreased stability. No treatment tried PTA. No alleviating factors. Denies any other complaints at this time.     Past Medical History:  Diagnosis Date  . CAD (coronary artery disease)   . Complete heart block (HCC) 12/05/07   s/p PPM  . ED (erectile dysfunction)   . HLD (hyperlipidemia)   . HTN (hypertension)   . Moderate asthma   . 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 Tobacco abuse   . Tobacco abuse     Patient Active Problem List   Diagnosis Date Noted  . Long term current use of anticoagulant 09/06/2012  . Paroxysmal atrial fibrillation (HCC) 09/02/2012  . PVD 09/01/2009  . Aortic valve disorder 11/27/2008  . ABDOMINAL ANEURYSM WITHOUT MENTION OF RUPTURE 11/27/2008  . Complete heart block (HCC) 04/13/2008  . BRADYCARDIA 11/11/2007  . CERUMEN IMPACTION, BILATERAL 08/06/2007  . TOBACCO ABUSE 11/15/2006  . CORONARY  ARTERY DISEASE 11/15/2006  . HYPERTENSION 10/12/2006  . TRANSIENT ISCHEMIC ATTACK, HX OF 10/12/2006    Past Surgical History:  Procedure Laterality Date  . lumbar hnp    . PACEMAKER INSERTION  12/05/07   by Fawn Kirk  . TONSILLECTOMY        Home Medications    Prior to Admission medications   Medication Sig Start Date End Date Taking? Authorizing Provider  amLODipine (NORVASC) 10 MG tablet Take 1 tablet (10 mg total) by mouth daily. 11/17/13   Tonny Bollman, MD  aspirin 81 MG tablet Take 81 mg by mouth daily.      [provider]  Bioflavonoid Products (BIOFLEX PO) Take 1 Dose by mouth daily.     [provider]  donepezil (ARICEPT) 5 MG tablet Take 1 tablet (5 mg total) by mouth at bedtime. 04/23/15   [name], MD  folic acid (FOLVITE) 1 MG tablet Take 1 mg by mouth daily.    [provider]  Glucosamine-Chondroitin (OSTEO BI-FLEX REGULAR STRENGTH PO) Take 1 tablet by mouth daily.     [provider]  naproxen sodium (ANAPROX) 220 MG tablet Take 220 mg by mouth daily.    [provider]  Omega-3 Fatty Acids (OMEGA 3 PO) Take 1 Dose by mouth daily. Reported on 05/17/2015    [provider]  OVER THE COUNTER MEDICATION Take 1-2 capsules by mouth daily. Omega XL    [provider]  simvastatin (  ZOCOR) 20 MG tablet Take 1 tablet (20 mg total) by mouth daily at 6 PM. 11/17/13   Tonny Bollman, MD  warfarin (COUMADIN) 5 MG tablet Take as directed by coumadin clinic Patient taking differently: Take 2.5-5 mg by mouth See admin instructions. Take 1 tablet on Monday, Wednesday and Friday then take 1/2 tablet the other days 02/28/13   Hillis Range, MD    Family History Family History  Problem Relation Age of Onset  . Dementia Mother   . Coronary artery disease Other        male 1st degree relative <60  . Skin cancer Other   . Stroke Other        F  1st degree relative <60; M 1st degree relative <50  . Thyroid disease Other     . Heart attack Neg Hx   . Hypertension Neg Hx     Social History Social History  Substance Use Topics  . Smoking status: Current Every Day Smoker  . Smokeless tobacco: Never Used     Comment: Smoked 1 ppd for more than 60 years, very clear that he will not quit.  . Alcohol use 0.0 oz/week     Comment: scotch     Allergies   Erythromycin base and Penicillins   Review of Systems Review of Systems  Musculoskeletal: Positive for arthralgias.     Physical Exam Updated Vital Signs BP (!) 145/72 (BP Location: Right Arm)   Pulse 62   Temp 97.5 F (36.4 C) (Oral)   Resp 14   Ht 5' 7.5" (1.715 m)   Wt 130 lb (59 kg)   SpO2 100%   BMI 20.06 kg/m   Physical Exam  Constitutional: He is oriented to person, place, and time. He appears well-developed and well-nourished.  HENT:  Head: Normocephalic and atraumatic.  Cardiovascular: Normal rate.   Pulmonary/Chest: Effort normal.  Musculoskeletal: He exhibits tenderness.  Mild right hip tenderness. Hip stable. Pelvis stable to rock. Good active straight leg raises bilaterally.    Neurological: He is alert and oriented to person, place, and time.  Skin: Skin is warm and dry.  Psychiatric: He has a normal mood and affect.  Nursing note and vitals reviewed.    ED Treatments / Results  COORDINATION OF CARE: 3:30 PM-Discussed next steps with pt. Pt verbalized understanding and is agreeable with the plan.    Labs (all labs ordered are listed, but only abnormal results are displayed) Labs Reviewed  PROTIME-INR - Abnormal; Notable for the following:       Result Value   Prothrombin Time 21.1 (*)    All other components within normal limits  TROPONIN I - Abnormal; Notable for the following:    Troponin I 0.03 (*)    All other components within normal limits  I-STAT CHEM 8, ED - Abnormal; Notable for the following:    BUN 21 (*)    Calcium, Ion 1.10 (*)    All other components within normal limits  CBC WITH  DIFFERENTIAL/PLATELET  URINALYSIS, ROUTINE W REFLEX MICROSCOPIC    EKG  EKG Interpretation  Date/Time:  Sunday Jul 16 2016 17:27:59 EDT Ventricular Rate:  60 PR Interval:    QRS Duration: 179 QT Interval:  495 QTC Calculation: 495 R Axis:   -85 Text Interpretation:  AV sequential or dual chamber electronic pacemaker Confirmed by Rubin Payor  MD, Ryana Montecalvo [phone]) on 07/16/2016 5:34:41 PM       Radiology Dg Chest 2 View  Result Date: 07/16/2016 CLINICAL  DATA:  Status post fall.  Chest pain. EXAM: CHEST  2 VIEW COMPARISON:  December 07, 2007 FINDINGS: The heart, hila, mediastinum, lungs, and pleura are unchanged with no acute abnormalities. A left-sided pacemaker is identified, in stable position. IMPRESSION: No active cardiopulmonary disease. Electronically Signed   By: Gerome Samavid  Williams III M.D   On: 07/16/2016 17:18   Ct Head Wo Contrast  Result Date: 07/16/2016 CLINICAL DATA:  Multiple falls, on blood thinners. History of hypertension, atrial fibrillation. EXAM: CT HEAD WITHOUT CONTRAST TECHNIQUE: Contiguous axial images were obtained from the base of the skull through the vertex without intravenous contrast. COMPARISON:  CT HEAD April 07, 2015 FINDINGS: BRAIN: No intraparenchymal hemorrhage, mass effect nor midline shift. The ventricles and sulci are normal for age. Confluent supratentorial white matter hypodensities are similar. No acute large vascular territory infarcts. No abnormal extra-axial fluid collections. Basal cisterns are patent. VASCULAR: Moderate calcific atherosclerosis of the carotid siphons. SKULL: No skull fracture. No significant scalp soft tissue swelling. SINUSES/ORBITS: Mild RIGHT sphenoid mucosal thickening in chronic bony remodeling. The included ocular globes and orbital contents are non-suspicious. OTHER: None. IMPRESSION: No acute intracranial process. Stable examination including moderate to severe chronic small vessel ischemic disease. Electronically Signed   By:  Awilda Metroourtnay  Bloomer M.D.   On: 07/16/2016 17:02   Dg Hip Unilat  With Pelvis 2-3 Views Right  Result Date: 07/16/2016 CLINICAL DATA:  Fall, right hip pain EXAM: DG HIP (WITH OR WITHOUT PELVIS) 2-3V RIGHT COMPARISON:  None. FINDINGS: No fracture or dislocation is seen. Mild degenerative changes of the bilateral hips. Visualized bony pelvis appears intact. Degenerative changes the lower lumbar spine. Vascular calcifications. IMPRESSION: No fracture or dislocation is seen. Electronically Signed   By: Charline BillsSriyesh  Krishnan M.D.   On: 07/16/2016 15:40    Procedures Procedures (including critical care time)  Medications Ordered in ED Medications  sodium chloride 0.9 % bolus 500 mL (not administered)     Initial Impression / Assessment and Plan / ED Course  I have reviewed the triage vital signs and the nursing notes.  Pertinent labs & imaging results that were available during my care of the patient were reviewed by me and considered in my medical decision making (see chart for details).     Patient with fall. Reportedly had some increasing confusion although he has a baseline dementia. Has been a little unsteady walking here. Slight shortness of breath. Labs showed troponin minimally above normal. Patient's daughters with him and states is not safe at home for him. States she's been trying to get him placed but is unable to get it done as an outpatient. Urine still pending but I think at this point would be reasonable for admission for his failure to thrive and difficulty walking. Troponin also could be followed. To be seen by cardiology also likely. Patient's EKG. Reportedly has had some nausea vomiting. Denies chest pain.  Final Clinical Impressions(s) / ED Diagnoses   Final diagnoses:  Fall, initial encounter  Failure to thrive in adult    New Prescriptions New Prescriptions   No medications on file   I personally performed the services described in this documentation, which was scribed  in my presence. The recorded information has been reviewed and is accurate.       Benjiman CorePickering, Dianca Owensby, MD 07/16/16 1921

## 2016-07-17 DIAGNOSIS — F17200 Nicotine dependence, unspecified, uncomplicated: Secondary | ICD-10-CM

## 2016-07-17 DIAGNOSIS — R627 Adult failure to thrive: Secondary | ICD-10-CM | POA: Diagnosis not present

## 2016-07-17 DIAGNOSIS — F0390 Unspecified dementia without behavioral disturbance: Secondary | ICD-10-CM

## 2016-07-17 DIAGNOSIS — R634 Abnormal weight loss: Secondary | ICD-10-CM

## 2016-07-17 DIAGNOSIS — I480 Paroxysmal atrial fibrillation: Secondary | ICD-10-CM | POA: Diagnosis not present

## 2016-07-17 DIAGNOSIS — E43 Unspecified severe protein-calorie malnutrition: Secondary | ICD-10-CM | POA: Insufficient documentation

## 2016-07-17 LAB — CBC
HEMATOCRIT: 39 % (ref 39.0–52.0)
HEMOGLOBIN: 13.1 g/dL (ref 13.0–17.0)
MCH: 31 pg (ref 26.0–34.0)
MCHC: 33.6 g/dL (ref 30.0–36.0)
MCV: 92.2 fL (ref 78.0–100.0)
Platelets: 157 10*3/uL (ref 150–400)
RBC: 4.23 MIL/uL (ref 4.22–5.81)
RDW: 13.6 % (ref 11.5–15.5)
WBC: 7.2 10*3/uL (ref 4.0–10.5)

## 2016-07-17 LAB — BASIC METABOLIC PANEL
ANION GAP: 8 (ref 5–15)
BUN: 14 mg/dL (ref 6–20)
CHLORIDE: 105 mmol/L (ref 101–111)
CO2: 29 mmol/L (ref 22–32)
Calcium: 8.5 mg/dL — ABNORMAL LOW (ref 8.9–10.3)
Creatinine, Ser: 0.95 mg/dL (ref 0.61–1.24)
GFR calc Af Amer: >60 mL/min (ref >60)
GLUCOSE: 81 mg/dL (ref 65–99)
Potassium: 3.5 mmol/L (ref 3.5–5.1)
Sodium: 142 mmol/L (ref 135–145)

## 2016-07-17 LAB — HEPATIC FUNCTION PANEL
ALT: 9 U/L — ABNORMAL LOW (ref 17–63)
AST: 23 U/L (ref 15–41)
Albumin: 3.7 g/dL (ref 3.5–5.0)
Alkaline Phosphatase: 43 U/L (ref 38–126)
Bilirubin, Direct: 0.1 mg/dL (ref 0.1–0.5)
Indirect Bilirubin: 0.7 mg/dL (ref 0.3–0.9)
Total Bilirubin: 0.8 mg/dL (ref 0.3–1.2)
Total Protein: 6.3 g/dL — ABNORMAL LOW (ref 6.5–8.1)

## 2016-07-17 LAB — PROTIME-INR
INR: 1.99
PROTHROMBIN TIME: 22.9 s — AB (ref 11.4–15.2)

## 2016-07-17 LAB — LIPASE, BLOOD: LIPASE: 44 U/L (ref 11–51)

## 2016-07-17 MED ORDER — PANTOPRAZOLE SODIUM 40 MG PO TBEC
40.0000 mg | DELAYED_RELEASE_TABLET | Freq: Every day | ORAL | Status: DC
  Administered 2016-07-17 – 2016-07-18 (×2): 40 mg via ORAL
  Filled 2016-07-17 (×2): qty 1

## 2016-07-17 MED ORDER — AMLODIPINE BESYLATE 10 MG PO TABS
10.0000 mg | ORAL_TABLET | Freq: Every day | ORAL | Status: DC
  Administered 2016-07-18: 10 mg via ORAL
  Filled 2016-07-17: qty 1

## 2016-07-17 MED ORDER — WARFARIN - PHARMACIST DOSING INPATIENT: Freq: Every day | Status: DC

## 2016-07-17 MED ORDER — ADULT MULTIVITAMIN W/MINERALS CH
1.0000 | ORAL_TABLET | Freq: Every day | ORAL | Status: DC
  Administered 2016-07-17 – 2016-07-18 (×2): 1 via ORAL
  Filled 2016-07-17 (×2): qty 1

## 2016-07-17 MED ORDER — ENSURE ENLIVE PO LIQD
237.0000 mL | Freq: Two times a day (BID) | ORAL | Status: DC
  Administered 2016-07-17 – 2016-07-18 (×3): 237 mL via ORAL

## 2016-07-17 NOTE — Evaluation (Signed)
Clinical/Bedside Swallow Evaluation Patient Details  Name: John Farmer MRN: [id number] Date of Birth: 1930/08/19  Today's Date: 07/17/2016 Time: SLP Start Time (ACUTE ONLY): 1615 SLP Stop Time (ACUTE ONLY): 1635 SLP Time Calculation (min) (ACUTE ONLY): 20 min  Past Medical History:  Past Medical History:  Diagnosis Date  . CAD (coronary artery disease)   . Complete heart block (HCC) 12/05/07   s/p PPM  . ED (erectile dysfunction)   . HLD (hyperlipidemia)   . HTN (hypertension)   . Moderate asthma   . 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 Tobacco abuse   . Tobacco abuse    Past Surgical History:  Past Surgical History:  Procedure Laterality Date  . lumbar hnp    . PACEMAKER INSERTION  12/05/07   by Fawn KirkJA  . TONSILLECTOMY     HPI:  John Farmer is a 83 year old Caucasian male past medical history significant for coronary artery disease, complete heart block status post pacemaker, essential hypertension, peripheral arterial disease, dementia. Pt with hx of repeated falls and functional decline including 30 lb unintentional weight loss over past 2-3 months. Chest x ray without active cardiopulmonary disease.    Assessment / Plan / Recommendation Clinical Impression  Pt presents with a suspected mild oropharyngeal dysphagia. Pt with loosely fitting dentures, SLP provided oral care as poor denture hygiene noted. Prolonged mastication of solid PO observed with mild oral residuals. Intermittent belching exhibited as well as occasional delayed throat clear. No overt s/sx of aspiration however pt admits to difficulty chewing solid meats. Recommend dysphagia 3 (mechanical soft) and thin liquid diet with medicines whole. Pt with hx of unintentional weight loss per chart in additon to dementia, recommend full supervision at meals to maximize nutrition and hydration and reinforce safe swallow strategies. SLP to follow up  for diet tolerance.  SLP Visit Diagnosis: Dysphagia, oropharyngeal phase (R13.12)    Aspiration Risk  Mild aspiration risk    Diet Recommendation   Dysphagia 3 (mechanical soft) thin liquids   Medication Administration: Whole meds with liquid    Other  Recommendations Oral Care Recommendations: Oral care BID   Follow up Recommendations Skilled Nursing facility;24 hour supervision/assistance      Frequency and Duration min 1 x/week  1 week       Prognosis Prognosis for Safe Diet Advancement: Good      Swallow Study   General Date of Onset: 07/16/16 HPI: John Farmer is a 83 year old Caucasian male past medical history significant for coronary artery disease, complete heart block status post pacemaker, essential hypertension, peripheral arterial disease, dementia. Pt with hx of repeated falls and functional decline including 30 lb unintentional weight loss over past 2-3 months. Chest x ray without active cardiopulmonary disease.  Type of Study: Bedside Swallow Evaluation Previous Swallow Assessment: none on file Diet Prior to this Study: Regular;Thin liquids Temperature Spikes Noted: No Respiratory Status: Room air History of Recent Intubation: No Behavior/Cognition: Alert;Cooperative;Pleasant mood Oral Cavity Assessment: Dry Oral Care Completed by SLP: Yes Oral Cavity - Dentition: Dentures, top;Dentures, bottom Vision: Functional for self-feeding Self-Feeding Abilities: Needs set up;Needs assist Patient Positioning: Upright in bed Baseline Vocal Quality: Low vocal intensity Volitional Cough: Strong Volitional Swallow: Able to elicit    Oral/Motor/Sensory Function Overall Oral Motor/Sensory Function: Within functional limits   Ice Chips Ice chips: Impaired Presentation: Spoon Oral Phase Functional Implications: Prolonged oral transit Pharyngeal Phase Impairments: Suspected delayed Swallow;Multiple swallows   Thin  Liquid Thin Liquid: Impaired Presentation:  Cup;Straw Oral Phase Functional Implications: Prolonged oral transit Pharyngeal  Phase Impairments: Suspected delayed Swallow;Multiple swallows;Throat Clearing - Delayed    Nectar Thick Nectar Thick Liquid: Not tested   Honey Thick Honey Thick Liquid: Not tested   Puree Puree: Within functional limits   Solid   GO   Solid: Impaired Presentation: Self Fed Oral Phase Impairments: Impaired mastication Oral Phase Functional Implications: Prolonged oral transit;Oral residue Pharyngeal Phase Impairments: Suspected delayed Swallow;Multiple swallows;Wet Vocal Quality;Throat Clearing - Delayed    Functional Assessment Tool Used: skilled observation Functional Limitations: Swallowing Swallow Current Status (B1478): At least 20 percent but less than 40 percent impaired, limited or restricted Swallow Goal Status [phone]): At least 1 percent but less than 20 percent impaired, limited or restricted   Marcene Duos MA, CCC-SLP Acute Care Speech Language Pathologist    Kennieth Rad 07/17/2016,4:51 PM

## 2016-07-17 NOTE — Progress Notes (Signed)
PROGRESS NOTE    John Farmer  ZOX:[id number] DOB: [birth year] DOA: 07/16/2016 PCP: Delorse Lek, MD   Outpatient Specialists:     Brief Narrative:  John Farmer is a 83 year old Caucasian male past medical history significant for coronary artery disease, complete heart block status post pacemaker, essential hypertension, peripheral arterial disease, dementia. Patient presented to the emergency department on 5/20 given concern for recurrent falls. Much the history is obtained by the daughter, as patient has baseline dementia at home. 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 He has had more falls at home. Most recently, patient fell 1 day prior to admission and landed on his right hip. Unclear events surrounding this fall. No recent changes in medications. Daughter also reports patient has a poor appetite and has lost nearly 30 pounds of unintentional weight over the preceding 3 months time. Patient lives at home with his daughter. Patient has a visiting home health nurse several days per week. Daughter is interested in having the patient placed in a rehabilitation or nursing facility, as the burden of care is too great for her at this time. Daughter does not believe the patient has had presyncope or syncopal episode surrounding recurrent falls. He takes medication as prescribed. Does not take many sedating medications at the daughter is aware of. Daughter has power of attorney given the patient's dementia. No recent fever or chills. Patient has otherwise been in his normal state of health. Patient denies any chest pain or shortness of breath.    Assessment & Plan:   Active Problems:   TOBACCO ABUSE   Paroxysmal atrial fibrillation (HCC)   Falls frequently   Dementia   Weight loss   Falls -CT scan negative for CVA  Weight loss with N/V -SLP eval -if vomiting continues,    Dementia -not able to be home alone  P a fib  -rate controlled -on coumadin  Tobacco abuse -encourage cessation    DVT prophylaxis:  SCD's  Code Status: DNR   Family Communication: daughter  Disposition Plan:  SNF   Consultants:         Subjective: No complaints  Objective: Vitals:   07/16/16 2000 07/16/16 2030 07/16/16 2115 07/17/16 0535  BP: (!) 151/73 (!) 148/71 (!) 160/68 (!) 144/69  Pulse: (!) 58 (!) 59 66 65  Resp: 15 16 18 18   Temp:   98 F (36.7 C) 98.3 F (36.8 C)  TempSrc:   Oral Oral  SpO2: 99% 98% 99% 98%  Weight:      Height:        Intake/Output Summary (Last 24 hours) at 07/17/16 1245 Last data filed at 07/17/16 0305  Gross per 24 hour  Intake              740 ml  Output              400 ml  Net              340 ml   Filed Weights   07/16/16 1459  Weight: 59 kg (130 lb)    Examination:  General exam: elderly, frail  Respiratory system: Clear to auscultation. Respiratory effort normal. Cardiovascular system: S1 & S2 heard, RRR. No JVD, murmurs, rubs, gallops or clicks. No pedal edema. Gastrointestinal system: Abdomen is nondistended, soft and nontender. No organomegaly or masses felt. Normal bowel sounds heard. Central nervous system: Alert. Extremities:  Symmetric 5 x 5 power. Skin: No rashes, lesions or ulcers Psychiatry: Judgement and insight appear normal. Mood & affect appropriate.     Data Reviewed: I have personally reviewed following labs and imaging studies  CBC:  Recent Labs Lab 07/16/16 1753 07/16/16 1803 07/17/16 0729  WBC 8.1  --  7.2  NEUTROABS 4.5  --   --   HGB 13.8 13.9 13.1  HCT 40.8 41.0 39.0  MCV 91.9  --  92.2  PLT 176  --  157   Basic Metabolic Panel:  Recent Labs Lab 07/16/16 1803 07/17/16 0729  NA 141 142  K 3.6 3.5  CL 105 105  CO2  --  29  GLUCOSE 86 81  BUN 21* 14  CREATININE 1.10 0.95  CALCIUM  --  8.5*   GFR: Estimated Creatinine Clearance: 49.2 mL/min (by C-G  formula based on SCr of 0.95 mg/dL). Liver Function Tests:  Recent Labs Lab 07/17/16 1118  AST 23  ALT 9*  ALKPHOS 43  BILITOT 0.8  PROT 6.3*  ALBUMIN 3.7    Recent Labs Lab 07/17/16 1118  LIPASE 44   No results for input(s): AMMONIA in the last 168 hours. Coagulation Profile:  Recent Labs Lab 07/16/16 1745 07/17/16 0729  INR 1.79 1.99   Cardiac Enzymes:  Recent Labs Lab 07/16/16 1745 07/16/16 2133  TROPONINI 0.03* <0.03   BNP (last 3 results) No results for input(s): PROBNP in the last 8760 hours. HbA1C: No results for input(s): HGBA1C in the last 72 hours. CBG: No results for input(s): GLUCAP in the last 168 hours. Lipid Profile: No results for input(s): CHOL, HDL, LDLCALC, TRIG, CHOLHDL, LDLDIRECT in the last 72 hours. Thyroid Function Tests:  Recent Labs  07/16/16 2133  TSH 2.446   Anemia Panel: No results for input(s): VITAMINB12, FOLATE, FERRITIN, TIBC, IRON, RETICCTPCT in the last 72 hours. Urine analysis:    Component Value Date/Time   COLORURINE YELLOW 07/16/2016 1931   APPEARANCEUR CLEAR 07/16/2016 1931   LABSPEC 1.019 07/16/2016 1931   PHURINE 5.0 07/16/2016 1931   GLUCOSEU NEGATIVE 07/16/2016 1931   HGBUR NEGATIVE 07/16/2016 1931   HGBUR trace-intact 11/08/2006 0812   BILIRUBINUR NEGATIVE 07/16/2016 1931   KETONESUR 5 (A) 07/16/2016 1931   PROTEINUR NEGATIVE 07/16/2016 1931   UROBILINOGEN 0.2 11/08/2006 0812   NITRITE NEGATIVE 07/16/2016 1931   LEUKOCYTESUR NEGATIVE 07/16/2016 1931     )No results found for this or any previous visit (from the past 240 hour(s)).    Anti-infectives    None       Radiology Studies: Dg Chest 2 View  Result Date: 07/16/2016 CLINICAL DATA:  Status post fall.  Chest pain. EXAM: CHEST  2 VIEW COMPARISON:  December 07, 2007 FINDINGS: The heart, hila, mediastinum, lungs, and pleura are unchanged with no acute abnormalities. A left-sided pacemaker is identified, in stable position. IMPRESSION: No  active cardiopulmonary disease. Electronically Signed   By: Gerome Samavid  Williams III M.D   On: 07/16/2016 17:18   Ct Head Wo Contrast  Result Date: 07/16/2016 CLINICAL DATA:  Multiple falls, on blood thinners. History of hypertension, atrial fibrillation. EXAM: CT HEAD WITHOUT CONTRAST TECHNIQUE: Contiguous axial images were obtained from the base of the skull through the vertex without intravenous contrast. COMPARISON:  CT HEAD April 07, 2015 FINDINGS: BRAIN: No intraparenchymal hemorrhage, mass effect nor midline shift. The ventricles and sulci are normal for age. Confluent supratentorial white matter hypodensities are similar. No acute large vascular territory infarcts. No abnormal extra-axial fluid  collections. Basal cisterns are patent. VASCULAR: Moderate calcific atherosclerosis of the carotid siphons. SKULL: No skull fracture. No significant scalp soft tissue swelling. SINUSES/ORBITS: Mild RIGHT sphenoid mucosal thickening in chronic bony remodeling. The included ocular globes and orbital contents are non-suspicious. OTHER: None. IMPRESSION: No acute intracranial process. Stable examination including moderate to severe chronic small vessel ischemic disease. Electronically Signed   By: Awilda Metro M.D.   On: 07/16/2016 17:02   Dg Hip Unilat  With Pelvis 2-3 Views Right  Result Date: 07/16/2016 CLINICAL DATA:  Fall, right hip pain EXAM: DG HIP (WITH OR WITHOUT PELVIS) 2-3V RIGHT COMPARISON:  None. FINDINGS: No fracture or dislocation is seen. Mild degenerative changes of the bilateral hips. Visualized bony pelvis appears intact. Degenerative changes the lower lumbar spine. Vascular calcifications. IMPRESSION: No fracture or dislocation is seen. Electronically Signed   By: Charline Bills M.D.   On: 07/16/2016 15:40        Scheduled Meds: . amLODipine  10 mg Oral Daily  . aspirin  81 mg Oral Daily  . donepezil  5 mg Oral QHS  . feeding supplement (ENSURE ENLIVE)  237 mL Oral BID BM  .  folic acid  1 mg Oral Daily  . pantoprazole  40 mg Oral Daily  . simvastatin  20 mg Oral q1800  . sodium chloride flush  3 mL Intravenous Q12H  . [START ON 07/18/2016] warfarin  2.5 mg Oral Q T,Th,S,Su-1800  . warfarin  5 mg Oral Q M,W,F-1800   Continuous Infusions: . sodium chloride       LOS: 0 days    Time spent: 25 min    Nasiah Lehenbauer U Seiya Silsby, DO [facility] Pager [phone]  If 7PM-7AM, please contact night-coverage [email or web address] Password [email or web address] 07/17/2016, 12:45 PM

## 2016-07-17 NOTE — Progress Notes (Signed)
ANTICOAGULATION CONSULT NOTE - Initial Consult  Pharmacy Consult for Warfarin Indication: atrial fibrillation  Allergies  Allergen Reactions  . Erythromycin Base Other (See Comments)    Unknown  . Penicillins Hives and Other (See Comments)    REACTION: Severe Hives    Patient Measurements: Height: 5' 7.5" (171.5 cm) Weight: 130 lb (59 kg) IBW/kg (Calculated) : 67.25  Vital Signs: Temp: 98.3 F (36.8 C) (05/21 0535) Temp Source: Oral (05/21 0535) BP: 144/69 (05/21 0535) Pulse Rate: 65 (05/21 0535)  Labs:  Recent Labs  07/16/16 1745  07/16/16 1753 07/16/16 1803 07/16/16 2133 07/17/16 0729  HGB  --   < > 13.8 13.9  --  13.1  HCT  --   --  40.8 41.0  --  39.0  PLT  --   --  176  --   --  157  LABPROT 21.1*  --   --   --   --  22.9*  INR 1.79  --   --   --   --  1.99  CREATININE  --   --   --  1.10  --  0.95  TROPONINI 0.03*  --   --   --  <0.03  --   < > = values in this interval not displayed.  Estimated Creatinine Clearance: 49.2 mL/min (by C-G formula based on SCr of 0.95 mg/dL).   Medical History: Past Medical History:  Diagnosis Date  . CAD (coronary artery disease)   . Complete heart block (HCC) 12/05/07   s/p PPM  . ED (erectile dysfunction)   . HLD (hyperlipidemia)   . HTN (hypertension)   . Moderate asthma   . 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 Tobacco abuse   . Tobacco abuse     Medications:  Prescriptions Prior to Admission  Medication Sig Dispense Refill Last Dose  . amLODipine (NORVASC) 10 MG tablet Take 1 tablet (10 mg total) by mouth daily. (Patient taking differently: Take 5 mg by mouth daily. ) 90 tablet 3 07/16/2016 at Unknown time  . aspirin 81 MG tablet Take 81 mg by mouth daily.     07/16/2016 at Unknown time  . donepezil (ARICEPT) 5 MG tablet Take 1 tablet (5 mg total) by mouth at bedtime. (Patient taking differently: Take 20 mg by mouth at bedtime. ) 90  tablet 3 07/16/2016 at Unknown time  . folic acid (FOLVITE) 1 MG tablet Take 1 mg by mouth daily.   07/16/2016 at Unknown time  . Glucosamine-Chondroitin (OSTEO BI-FLEX REGULAR STRENGTH PO) Take 1 tablet by mouth daily.    07/16/2016 at Unknown time  . mirtazapine (REMERON) 7.5 MG tablet Take 7.5 mg by mouth daily.    07/16/2016 at Unknown time  . naproxen sodium (ANAPROX) 220 MG tablet Take 220 mg by mouth daily.   07/16/2016 at Unknown time  . Omega-3 Fatty Acids (OMEGA 3 PO) Take 1 capsule by mouth daily. Reported on 05/17/2015   07/16/2016 at Unknown time  . simvastatin (ZOCOR) 20 MG tablet Take 1 tablet (20 mg total) by mouth daily at 6 PM. 90 tablet 3 07/16/2016 at Unknown time  . warfarin (COUMADIN) 5 MG tablet Take as directed by coumadin clinic (Patient taking differently: Take 2.5-5 mg by mouth See admin instructions. Take 5 mg by mouth daily on Monday, Wednesday and Friday then take 2.5 mg by mouth daily on all other days) 30 tablet 3 07/16/2016  at PM    Assessment: 83 yo M presented to ED 5/20 after recent falls.  Daughter reports 2-3 month history of functional decline and weight loss.  Pt on Coumadin PTA with INR 1.99.  Last dose 5/20.  Goal of Therapy:  INR 2-3 Monitor platelets by anticoagulation protocol: Yes   Plan:  Restart home regimen as INR near goal. Daily INR  Toys 'R' UsKimberly Liat Mayol, Pharm.D., BCPS Clinical Pharmacist Pager: [phone] Clinical phone for 07/17/2016 from 8:30-4:00 is [phone]. After 4pm, please call [facility] [phone]) for assistance. 07/17/2016 12:53 PM

## 2016-07-17 NOTE — NC FL2 (Signed)
[place] MEDICAID FL2 LEVEL OF CARE SCREENING TOOL     IDENTIFICATION  Patient Name: John NearingDonald E Bartolome Birthdate: 25-Apr-1930 Sex: male Admission Date (Current Location): 07/16/2016  [facility] [place]   Facility and Address:  [facility] [place], [address], [place], [place] [address]      Provider Number: [id number]  Attending Physician Name and Address:  Joseph ArtVann, Jessica U, DO  Relative Name and Phone Number:  Melrose NakayamaDebbie, Daughter, [phone]    Current Level of Care: Hospital Recommended Level of Care: Skilled Nursing Facility Prior Approval Number:    Date Approved/Denied:   PASRR Number: [id number]  Discharge Plan: SNF    Current Diagnoses: Patient Active Problem List   Diagnosis Date Noted  . Falls frequently 07/16/2016  . Long term current use of anticoagulant 09/06/2012  . Paroxysmal atrial fibrillation (HCC) 09/02/2012  . PVD 09/01/2009  . Aortic valve disorder 11/27/2008  . ABDOMINAL ANEURYSM WITHOUT MENTION OF RUPTURE 11/27/2008  . Complete heart block (HCC) 04/13/2008  . BRADYCARDIA 11/11/2007  . CERUMEN IMPACTION, BILATERAL 08/06/2007  . TOBACCO ABUSE 11/15/2006  . CORONARY ARTERY DISEASE 11/15/2006  . HYPERTENSION 10/12/2006  . TRANSIENT ISCHEMIC ATTACK, HX OF 10/12/2006    Orientation RESPIRATION BLADDER Height & Weight     Self, Place  Normal Continent Weight: 59 kg (130 lb) Height:  5' 7.5" (171.5 cm)  BEHAVIORAL SYMPTOMS/MOOD NEUROLOGICAL BOWEL NUTRITION STATUS      Continent Diet (Please see DC Summary)  AMBULATORY STATUS COMMUNICATION OF NEEDS Skin   Limited Assist Verbally Normal                       Personal Care Assistance Level of Assistance  Bathing, Feeding, Dressing Bathing Assistance: Maximum assistance Feeding assistance: Independent Dressing Assistance: Limited assistance     Functional Limitations Info             SPECIAL CARE FACTORS FREQUENCY  PT (By licensed PT)      PT Frequency: 5x/week              Contractures      Additional Factors Info  Code Status, Allergies Code Status Info: DNR Allergies Info: Erythromycin Base, Penicillins           Current Medications (07/17/2016):  This is the current hospital active medication list Current Facility-Administered Medications  Medication Dose Route Frequency Provider Last Rate Last Dose  . 0.9 %  sodium chloride infusion  250 mL Intravenous PRN Vista MinkWilson, Matthew W, MD      . amLODipine (NORVASC) tablet 10 mg  10 mg Oral Daily Vista MinkWilson, Matthew W, MD      . aspirin chewable tablet 81 mg  81 mg Oral Daily Vista MinkWilson, Matthew W, MD      . donepezil (ARICEPT) tablet 5 mg  5 mg Oral QHS Vista MinkWilson, Matthew W, MD      . feeding supplement (ENSURE ENLIVE) (ENSURE ENLIVE) liquid 237 mL  237 mL Oral BID BM Vista MinkWilson, Matthew W, MD      . folic acid (FOLVITE) tablet 1 mg  1 mg Oral Daily Vista MinkWilson, Matthew W, MD      . naproxen (NAPROSYN) tablet 250 mg  250 mg Oral Q breakfast Vista MinkWilson, Matthew W, MD      . simvastatin (ZOCOR) tablet 20 mg  20 mg Oral q1800 Vista MinkWilson, Matthew W, MD      . sodium chloride flush (NS) 0.9 % injection 3 mL  3  mL Intravenous Q12H Vista Mink, MD   3 mL at 07/16/16 2300  . sodium chloride flush (NS) 0.9 % injection 3 mL  3 mL Intravenous PRN Vista Mink, MD      . Melene Muller ON 07/18/2016] warfarin (COUMADIN) tablet 2.5 mg  2.5 mg Oral Q T,Th,S,Su-1800 Vista Mink, MD      . warfarin (COUMADIN) tablet 5 mg  5 mg Oral Q M,W,F-1800 Vista Mink, MD      . Warfarin - Physician Dosing Inpatient   Does not apply q1800 Vista Mink, MD         Discharge Medications: Please see discharge summary for a list of discharge medications.  Relevant Imaging Results:  Relevant Lab Results:   Additional Information SSN: [id number] [place], [place]

## 2016-07-17 NOTE — Progress Notes (Signed)
Notified MD that pts dtr would like abd us to eval for n/v and weight loss.

## 2016-07-17 NOTE — Progress Notes (Signed)
   07/17/16 0955  Clinical Encounter Type  Visited With Patient and family together  Visit Type Spiritual support  Spiritual Encounters  Spiritual Needs Emotional  Stress Factors  Patient Stress Factors Health changes  Family Stress Factors Family relationships  Received page of Pt's daughter being tearful. Introduction to Pt and daughter. Daughter reports coping and declined services at this time.

## 2016-07-17 NOTE — Progress Notes (Signed)
Patient able to discharge to [facility] for short term rehab tomorrow, as per daughter's request.   Loyal BubaNadia Rozanne Heumann LCSWA [phone]

## 2016-07-17 NOTE — Care Management Note (Signed)
Case Management Note  Patient Details  Name: Kimberlee NearingDonald E Lips MRN: [id number] Date of Birth: 1930-03-13  Subjective/Objective:      Falls, Dementia, afib              Action/Plan: Discharge Planning:  NCM spoke to dtr, [name] and pt was living in her home. States she has applied for his VA benefits and states he is close to being approved. Reports he has cane at home. CSW following for placement. Scheduled dc to SNF-[facility] for rehab. Explained to dtr, [name] [phone] the Advance Beneficiary Notice of Noncoverage and verbalized understanding. Left copies in room for dtr.   PCP Delorse LekBURNETT, BRENT A MD   Expected Discharge Date:  07/17/16               Expected Discharge Plan:  Skilled Nursing Facility  In-House Referral:  Clinical Social Work  Discharge planning Services  CM Consult  Post Acute Care Choice:  NA Choice offered to:  NA  DME Arranged:  N/A DME Agency:  NA  HH Arranged:  NA HH Agency:  NA  Status of Service:  Completed, signed off  If discussed at [employer] Length of Stay Meetings, dates discussed:    Additional Comments:  Elliot CousinShavis, Niyanna Asch Ellen, RN 07/17/2016, 4:24 PM

## 2016-07-17 NOTE — Evaluation (Signed)
Physical Therapy Evaluation Patient Details Name: John Farmer MRN: [id number] DOB: [birth year] Today's Date: 07/17/2016   History of Present Illness  [name] is a 83 year old Caucasian male past medical history significant for coronary artery disease, complete heart block status post pacemaker, essential hypertension, peripheral arterial disease, dementia. Patient presented to the emergency department on 5/20 given concern for recurrent falls.     Clinical Impression  Pt admitted with above diagnosis. Pt currently with functional limitations due to the deficits listed below (see PT Problem List).  Pt will benefit from skilled PT to increase their independence and safety with mobility to allow discharge to the venue listed below.  Pt with history of dementia and frequent falls at home.  Both pt and daughter state it seems like his legs just give way.  Daughter also reports that while she is at work, he will leave the condo and walk to [employer] and then get a ride home from strangers. Pt demonstrated poor balance in standing and with dynamic sitting.  Pt impulsive with standing and increased lateral sway with gait even with RW.  Recommend SNF.     Follow Up Recommendations SNF;Supervision/Assistance - 24 hour    Equipment Recommendations  Rolling walker with 5" wheels    Recommendations for Other Services       Precautions / Restrictions Precautions Precautions: Fall      Mobility  Bed Mobility Overal bed mobility: Needs Assistance Bed Mobility: Supine to Sit     Supine to sit: Min guard;HOB elevated     General bed mobility comments: Pt able to come to EOB with min/guard with HOB elevated and heavy use of rail  Transfers Overall transfer level: Needs assistance Equipment used: Rolling walker (2 wheeled) Transfers: Sit to/from Stand Sit to Stand: Min guard         General transfer comment: cues for hand placement  Ambulation/Gait Ambulation/Gait assistance:  Min guard;Min assist Ambulation Distance (Feet): 40 Feet Assistive device: Rolling walker (2 wheeled) Gait Pattern/deviations: Step-through pattern;Drifts right/left;Trunk flexed;Narrow base of support     General Gait Details: Pt with unsteady gait pattern with min/guard to MIN A for balance due to increased lateral sway.  Tactile cueing needed for progression of RW.  Stairs            Wheelchair Mobility    Modified Rankin (Stroke Patients Only)       Balance Overall balance assessment: History of Falls;Needs assistance   Sitting balance-Leahy Scale: Poor Sitting balance - Comments: Fair static, Poor dynamic.  When he tied gown needed MIN A to maintain balance due to posterior lean Postural control: Posterior lean   Standing balance-Leahy Scale: Poor Standing balance comment: requires UE support                             Pertinent Vitals/Pain Pain Assessment: No/denies pain    Home Living Family/patient expects to be discharged to:: Private residence Living Arrangements: Children Available Help at Discharge: Family;Available PRN/intermittently Type of Home: House Home Access: Level entry     Home Layout: Two level Home Equipment: Cane - single point Additional Comments: NT 3x/week for bathing    Prior Function Level of Independence: Needs assistance   Gait / Transfers Assistance Needed: Amb with cane with increase in falls, especially in last 6 weeks           Hand Dominance        Extremity/Trunk Assessment  Upper Extremity Assessment Upper Extremity Assessment: Overall WFL for tasks assessed    Lower Extremity Assessment Lower Extremity Assessment: Overall WFL for tasks assessed       Communication   Communication: No difficulties  Cognition Arousal/Alertness: Awake/alert Behavior During Therapy: Impulsive Overall Cognitive Status: History of cognitive impairments - at baseline                                  General Comments: Pt with decreased short term memory and not oriented to time.      General Comments      Exercises     Assessment/Plan    PT Assessment Patient needs continued PT services  PT Problem List Decreased activity tolerance;Decreased balance;Decreased mobility;Decreased safety awareness;Decreased cognition       PT Treatment Interventions DME instruction;Gait training;Functional mobility training;Therapeutic activities;Therapeutic exercise    PT Goals (Current goals can be found in the Care Plan section)  Acute Rehab PT Goals Patient Stated Goal: skilled nursing PT Goal Formulation: With family Time For Goal Achievement: 07/31/16 Potential to Achieve Goals: Good    Frequency Min 3X/week   Barriers to discharge Inaccessible home environment;Decreased caregiver support flight of stairs and daughter works    Co-evaluation               AM-PAC PT "6 Clicks" Daily Activity  Outcome Measure Difficulty turning over in bed (including adjusting bedclothes, sheets and blankets)?: A Little Difficulty moving from lying on back to sitting on the side of the bed? : A Little Difficulty sitting down on and standing up from a chair with arms (e.g., wheelchair, bedside commode, etc,.)?: A Little Help needed moving to and from a bed to chair (including a wheelchair)?: A Little Help needed walking in hospital room?: A Little Help needed climbing 3-5 steps with a railing? : A Lot 6 Click Score: 17    End of Session Equipment Utilized During Treatment: Gait belt Activity Tolerance: Patient tolerated treatment well Patient left: in chair;with chair alarm set;with family/visitor present Nurse Communication: Mobility status PT Visit Diagnosis: Unsteadiness on feet (R26.81);Repeated falls (R29.6);Other abnormalities of gait and mobility (R26.89)    Time: 4098-1191 PT Time Calculation (min) (ACUTE ONLY): 25 min   Charges:   PT Evaluation $PT Eval Low Complexity: 1  Procedure PT Treatments $Gait Training: 8-22 mins   PT G Codes:   PT G-Codes **NOT FOR INPATIENT CLASS** Functional Assessment Tool Used: AM-PAC 6 Clicks Basic Mobility Functional Limitation: Mobility: Walking and moving around Mobility: Walking and Moving Around Current Status (Y7829): At least 40 percent but less than 60 percent impaired, limited or restricted Mobility: Walking and Moving Around Goal Status [phone]): At least 1 percent but less than 20 percent impaired, limited or restricted    Clydie Braun L. Katrinka Blazing, [place] Pager [phone] 07/17/2016   Enzo Montgomery 07/17/2016, 10:00 AM

## 2016-07-17 NOTE — Progress Notes (Signed)
Initial Nutrition Assessment  DOCUMENTATION CODES:   Severe malnutrition in context of chronic illness  INTERVENTION:   -Continue Ensure Enlive po BID, each supplement provides 350 kcal and 20 grams of protein -MVI daily  NUTRITION DIAGNOSIS:   Malnutrition (Severe) related to chronic illness (dementia) as evidenced by severe depletion of body fat, severe depletion of muscle mass, percent weight loss.  GOAL:   Patient will meet greater than or equal to 90% of their needs  MONITOR:   PO intake, Supplement acceptance, Labs, Weight trends, Skin, I & O's  REASON FOR ASSESSMENT:   Malnutrition Screening Tool    ASSESSMENT:   [name] is a 83 year old Caucasian male past medical history significant for coronary artery disease, complete heart block status post pacemaker, essential hypertension, peripheral arterial disease, dementia. Patient presented to the emergency department on 5/20 given concern for recurrent falls.  Pt admitted with recurrent falls.   Pt reports good appetite; he confirms consuming 100% of breakfast meal (eggs, grits, and fruit), which is consistent with reports on doc flowsheets. Pt estimates he consumes 2-3 meals PTA- he consumes an egg sandwich during the day and heats up food that his daughter prepares for him.   Pt reports that he has lost weight, revealing UBW of around 140#. Pt pointed his ribs and says "I can tell I've lost weight" and shares that his clothes are looser. Pt shares that he has felt his clothing is looser. Per wt hx, pt has experienced a 9.7% wt loss over the past 4 months, which is significant for time frame.   Pt consumed about 1/3 of Ensure supplement at bedside (vanilla flavor). Pt reports the flavor is okay, but would likely accept better if it was a different flavor. Pt amenable to continue Ensure supplements and reports he was considering consuming PTA.   Nutrition-Focused physical exam completed. Findings are severe fat  depletion, severe muscle depletion, and no edema.   Discussed importance of consuming meals and supplement to promote healing.   Labs reviewed.   Diet Order:  Diet Heart Room service appropriate? Yes; Fluid consistency: Thin  Skin:  Reviewed, no issues  Last BM:  07/17/16  Height:   Ht Readings from Last 1 Encounters:  07/16/16 5' 7.5" (1.715 m)    Weight:   Wt Readings from Last 1 Encounters:  07/16/16 130 lb (59 kg)    Ideal Body Weight:  68.6 kg  BMI:  Body mass index is 20.06 kg/m.  Estimated Nutritional Needs:   Kcal:  1500-1700  Protein:  70-85 grams  Fluid:  1.5-1.7 L  EDUCATION NEEDS:   Education needs addressed  John Rowe A. Mayford KnifeWilliams, RD, LDN, CDE Pager: [phone] After hours Pager: [phone]

## 2016-07-17 NOTE — Care Management Obs Status (Signed)
MEDICARE OBSERVATION STATUS NOTIFICATION   Patient Details  Name: John Farmer MRN: [id number] Date of Birth: Sep 29, 1930   Medicare Observation Status Notification Given:  Yes (Contacted dtr, Judd Gaudierebbie Flanagan # [phone] [id number] and reviewed notice.  Left copy)    Elliot CousinShavis, Keyaira Clapham Ellen, RN 07/17/2016, 4:24 PM

## 2016-07-17 NOTE — Clinical Social Work Note (Signed)
Clinical Social Work Assessment  Patient Details  Name: John Farmer MRN: [id number] Date of Birth: Sep 19, 1930  Date of referral:  07/17/16               Reason for consult:  Facility Placement                Permission sought to share information with:  Facility Medical sales representativeContact Representative, Family Supports Permission granted to share information::  No  Name::     Public affairs consultantDebbie  Agency::  SNFs  Relationship::  Daughter  Contact Information:  [phone]  Housing/Transportation Living arrangements for the past 2 months:  Single Family Home Source of Information:  Adult Children Patient Interpreter Needed:  None Criminal Activity/Legal Involvement Pertinent to Current Situation/Hospitalization:  No - Comment as needed Significant Relationships:  Adult Children Lives with:  Adult Children Do you feel safe going back to the place where you live?  No Need for family participation in patient care:  Yes (Comment)  Care giving concerns:  CSW received consult for possible SNF placement at time of discharge. Patient is disoriented. CSW spoke with patient's daughter regarding PT recommendation of SNF placement at time of discharge. [name] reported that she is no longer able to care for patient at their home given patient's current physical needs and fall risk. He has lived with her the past 15 years. [name] expressed understanding of PT recommendation and is agreeable to SNF placement at time of discharge. CSW to continue to follow and assist with discharge planning needs.   Social Worker assessment / plan:  CSW spoke with patient's daughter concerning [facility] before returning home.  Employment status:  Retired Database administratornsurance information:  Managed Medicare PT Recommendations:  Skilled Nursing Facility Information / Referral to community resources:  Skilled Nursing Facility  Patient/Family's Response to care:  [name] recognizes need for rehab before  returning home and is agreeable to a SN[place]. Patient reported preference for [facility] [place]. [name] believed patient could transition into long term care from SNF rehab, but CSW explained that insurance does not pay for long term care. [name] expressed frustration with her situation. She reports that patient does not get enough money to pay for long term care and that she can't afford a sitter. CSW provided empathetic listening. She is in the [place] on [place]. CSW explained that the [place] would be helpful if patient needed long term care. She was advised by the John Farmer case worker to hold of on submitting the Medicaid application until she had heard a determination from the [place].   Patient/Family's Understanding of and Emotional Response to Diagnosis, Current Treatment, and Prognosis:  Patient/family is realistic regarding therapy needs and expressed being hopeful for SNF placement. [name] expressed understanding of CSW role and discharge process as well as patient's declining medical condiiton. No questions/concerns about plan or treatment.    Emotional Assessment Appearance:  Appears stated age Attitude/Demeanor/Rapport:  Other Affect (typically observed):  Unable to Assess Orientation:  Oriented to Place, Oriented to Self Alcohol / Substance use:  Not Applicable Psych involvement (Current and /or in the community):  No (Comment)  Discharge Needs  Concerns to be addressed:  Care Coordination Readmission within the last 30 days:  No Current discharge risk:  Cognitively Impaired Barriers to Discharge:  Continued Medical Work up   [employer] Incadia S Clark Clowdus, Theresia MajorsLCSWA 07/17/2016, 4:39 PM

## 2016-07-18 DIAGNOSIS — R627 Adult failure to thrive: Secondary | ICD-10-CM | POA: Diagnosis not present

## 2016-07-18 DIAGNOSIS — E43 Unspecified severe protein-calorie malnutrition: Secondary | ICD-10-CM

## 2016-07-18 DIAGNOSIS — I480 Paroxysmal atrial fibrillation: Secondary | ICD-10-CM | POA: Diagnosis not present

## 2016-07-18 DIAGNOSIS — F0390 Unspecified dementia without behavioral disturbance: Secondary | ICD-10-CM | POA: Diagnosis not present

## 2016-07-18 LAB — PROTIME-INR
INR: 1.53
Prothrombin Time: 18.5 seconds — ABNORMAL HIGH (ref 11.4–15.2)

## 2016-07-18 MED ORDER — PANTOPRAZOLE SODIUM 40 MG PO TBEC: 40.0000 mg | DELAYED_RELEASE_TABLET | Freq: Every day | ORAL | Status: DC

## 2016-07-18 MED ORDER — ENSURE ENLIVE PO LIQD: 237.0000 mL | Freq: Two times a day (BID) | ORAL | 12 refills | Status: AC

## 2016-07-18 MED ORDER — DONEPEZIL HCL 5 MG PO TABS: 20.0000 mg | ORAL_TABLET | Freq: Every day | ORAL | Status: DC

## 2016-07-18 NOTE — Discharge Planning (Signed)
Patient left -with EMS and daughter/POA (also works in OR at [employer]), came up to floor as leaving floor.  Daughter was extremely upset, indicating that she had spoke with SW and voiced desire to wk today.Marland Kitchen. also told patient would be able to discharge after she got off work (about 3PM).  Paperwork was not ready for her to sign for facility, so SW agreed to bring down to the OR when ready.  CM apologized for misunderstanding.

## 2016-07-18 NOTE — Discharge Planning (Addendum)
Patient IV removed.  Patient Discharge papers printed and placed in packet for facility.  Informed of suggested FU appts and discharge meds being ordered.  Told and agreed that daughter/(POA) would fill out paperwork on lunch break (at [employer]) and then SW will faxed over to [facility].  Report called to facility and s/w Burgess AmorAshley Whiteside, RN.  Packet, including DC paperwork and DNR documentation being sent with EMS. RN assessment and VS revealed stability for discharge. EMS has been contacted to transport. Waiting on arrival and daughter is also aware of discharge plan.

## 2016-07-18 NOTE — Progress Notes (Signed)
Patient will DC to: [facility] Anticipated DC date: 07/18/16 Family notified: Daughter Transport by: Sharin MonsPTAR   Per MD patient ready for DC to [facility]. RN, patient, patient's family, and facility notified of DC. Discharge Summary sent to facility. RN given number for report. DC packet on chart. Ambulance transport requested for patient.   CSW signing off.  Cristobal GoldmannNadia Marchelle Rinella, Connecticut[place]CSWA Clinical Social Worker [phone]

## 2016-07-18 NOTE — Clinical Social Work Placement (Signed)
   CLINICAL SOCIAL WORK PLACEMENT  NOTE  Date:  07/18/2016  Patient Details  Name: John Farmer MRN: [id number] Date of Birth: [birth year]  Clinical Social Work is seeking post-discharge placement for this patient at the Skilled  Nursing Facility level of care (*CSW will initial, date and re-position this form in  chart as items are completed):  Yes   Patient/family provided with [place] Clinical Social Work Department's list of facilities offering this level of care within the geographic area requested by the patient (or if unable, by the patient's family).  Yes   Patient/family informed of their freedom to choose among providers that offer the needed level of care, that participate in Medicare, Medicaid or managed care program needed by the patient, have an available bed and are willing to accept the patient.  Yes   Patient/family informed of [facility]'s ownership interest in [facility] and [facility], as well as of the fact that they are under no obligation to receive care at these facilities.  PASRR submitted to EDS on 07/17/16     PASRR number received on 07/17/16     Existing PASRR number confirmed on       FL2 transmitted to all facilities in geographic area requested by pt/family on 07/17/16     FL2 transmitted to all facilities within larger geographic area on       Patient informed that his/her managed care company has contracts with or will negotiate with certain facilities, including the following:        Yes   Patient/family informed of bed offers received.  Patient chooses bed at [facility] [place]     Physician recommends and patient chooses bed at      Patient to be transferred to [facility] [place] on 07/18/16.  Patient to be transferred to facility by PTAR     Patient family notified on 07/18/16 of transfer.  Name of family member notified:  Eunice Blaseebbie, Daughter     PHYSICIAN Please sign DNR     Additional Comment:     _______________________________________________ Mearl LatinNadia S Joseph Bias, LCSWA 07/18/2016, 10:37 AM

## 2016-07-18 NOTE — Progress Notes (Signed)
[facility] at 12:37[facility] stating that patient's daughter was upset that patient had been discharge to SNF without her being present. CSW showed RNCM the texts that had been shared between CSW and patient's daughter. At 9:13am CSW left patient's daughter a Engineer, technical salesvoicemail. At 9:25am she texted CSW to state she would be in the OR. CSW responded back at 10:30am that [facility] wanted patient to be transported within the hour due to wanting to space out their discharges. CSW stated the ambulance would be called and would likely pick patient up in the next hour. CSW asked daughter to call CSW when daughter had a break to complete paperwork. [name] reponded with "Ok. I understand. I'm scrubbed in right now and have someone texting for me. I will be in surgery until around 12:15pm. I can call you then. I'm sorry! I was told it would be fine for me to work today!" CSW responded by saying that CSW was available whenever daughter needed. CSW arranged PTAR transportation. [facility] was unable to get CSW the admissions paperwork for daughter to sign until 12:34pm. CSW called patient's daughter to see if we could do the paperwork before patient's daughter went back into surgery. [name] was tearful and said that she could not and that CSW was supposed to have paperwork already ready and that she wanted to be present to see the patient. CSW reminded her of the text communication and that daughter had not indicated that in her communication with CSW. CSW offered to leave admission paperwork at the front desk of her office. No response from daughter and she hung up on CSW. [facility] admissions, John CornfieldStephanie, stated that she would follow up with the daugher to complete paperwork at another time.   John Farmer LCSWA [phone]

## 2016-07-18 NOTE — Discharge Summary (Signed)
Physician Discharge Summary  Kimberlee NearingDonald E Mosely [id number] DOB: 1930-09-02 DOA: 07/16/2016  PCP: Delorse LekBurnett, Brent A, MD  Admit date: 07/16/2016 Discharge date: 07/18/2016   Recommendations for Outpatient Follow-Up:   1. DYS 3 thin diet 2. Follow INR 3. Consider palliative care referral after rehabilitation 4. DNR   Discharge Diagnosis:   Active Problems:   TOBACCO ABUSE   Paroxysmal atrial fibrillation (HCC)   Falls frequently   Dementia   Weight loss   Protein-calorie malnutrition, severe   Discharge disposition:  SNF:  Discharge Condition: Improved.  Diet recommendation: DYS 3 diet  Wound care: None.   History of Present Illness:   John [facility] is a 83 year old Caucasian male past medical history significant for coronary artery disease, complete heart block status post pacemaker, essential hypertension, peripheral arterial disease, dementia. Patient presented to the emergency department on 5/20 given concern for recurrent falls. Much the history is obtained by the daughter, as patient has baseline dementia at home. Per daughter, who is an employee at [employer] and works as a TEFL teachersurgical tech, patient has had a functional decline with preceding 2-3 months. He has had more falls at home. Most recently, patient fell 1 day prior to admission and landed on his right hip. Unclear events surrounding this fall. No recent changes in medications. Daughter also reports patient has a poor appetite and has lost nearly 30 pounds of unintentional weight over the preceding 3 months time. Patient lives at home with his daughter. Patient has a visiting home health nurse several days per week. Daughter is interested in having the patient placed in a rehabilitation or nursing facility, as the burden of care is too great for her at this time. Daughter does not believe the patient has had presyncope or syncopal episode surrounding recurrent falls. He takes medication as prescribed. Does not take  many sedating medications at the daughter is aware of. Daughter has power of attorney given the patient's dementia. No recent fever or chills. Patient has otherwise been in his normal state of health. Patient denies any chest pain or shortness of breath.   Hospital Course by Problem:   Falls -CT scan negative for CVA -strength is good, appears to be balance related  Weight loss with N/V -SLP eval -if vomiting continues, outpatient work up although daughter does not think patient would pursue treatment for cancer etc -palliative care referral  Dementia -not able to be home alone -SNF  P a fib  -rate controlled -on coumadin  H/o Tobacco abuse -cessation    Medical Consultants:    None.   Discharge Exam:   Vitals:   07/17/16 2130 07/18/16 0609  BP: (!) 114/49 (!) 146/61  Pulse: 60 60  Resp: 20   Temp: 97.9 F (36.6 C) 98.2 F (36.8 C)   Vitals:   07/17/16 0535 07/17/16 1356 07/17/16 2130 07/18/16 0609  BP: (!) 144/69 (!) 105/50 (!) 114/49 (!) 146/61  Pulse: 65 (!) 59 60 60  Resp: 18 16 20    Temp: 98.3 F (36.8 C) 97.7 F (36.5 C) 97.9 F (36.6 C) 98.2 F (36.8 C)  TempSrc: Oral Oral  Oral  SpO2: 98% 99% 96% 98%  Weight:      Height:        Gen:  NAD- in bed-- candy at bedside   The results of significant diagnostics from this hospitalization (including imaging, microbiology, ancillary and laboratory) are listed below for reference.     Procedures and Diagnostic Studies:   Dg Chest  2 View  Result Date: 07/16/2016 CLINICAL DATA:  Status post fall.  Chest pain. EXAM: CHEST  2 VIEW COMPARISON:  December 07, 2007 FINDINGS: The heart, hila, mediastinum, lungs, and pleura are unchanged with no acute abnormalities. A left-sided pacemaker is identified, in stable position. IMPRESSION: No active cardiopulmonary disease. Electronically Signed   By: Gerome Sam III M.D   On: 07/16/2016 17:18   Ct Head Wo Contrast  Result Date: 07/16/2016 CLINICAL  DATA:  Multiple falls, on blood thinners. History of hypertension, atrial fibrillation. EXAM: CT HEAD WITHOUT CONTRAST TECHNIQUE: Contiguous axial images were obtained from the base of the skull through the vertex without intravenous contrast. COMPARISON:  CT HEAD April 07, 2015 FINDINGS: BRAIN: No intraparenchymal hemorrhage, mass effect nor midline shift. The ventricles and sulci are normal for age. Confluent supratentorial white matter hypodensities are similar. No acute large vascular territory infarcts. No abnormal extra-axial fluid collections. Basal cisterns are patent. VASCULAR: Moderate calcific atherosclerosis of the carotid siphons. SKULL: No skull fracture. No significant scalp soft tissue swelling. SINUSES/ORBITS: Mild RIGHT sphenoid mucosal thickening in chronic bony remodeling. The included ocular globes and orbital contents are non-suspicious. OTHER: None. IMPRESSION: No acute intracranial process. Stable examination including moderate to severe chronic small vessel ischemic disease. Electronically Signed   By: Awilda Metro M.D.   On: 07/16/2016 17:02   Dg Hip Unilat  With Pelvis 2-3 Views Right  Result Date: 07/16/2016 CLINICAL DATA:  Fall, right hip pain EXAM: DG HIP (WITH OR WITHOUT PELVIS) 2-3V RIGHT COMPARISON:  None. FINDINGS: No fracture or dislocation is seen. Mild degenerative changes of the bilateral hips. Visualized bony pelvis appears intact. Degenerative changes the lower lumbar spine. Vascular calcifications. IMPRESSION: No fracture or dislocation is seen. Electronically Signed   By: Charline Bills M.D.   On: 07/16/2016 15:40     Labs:   Basic Metabolic Panel:  Recent Labs Lab 07/16/16 1803 07/17/16 0729  NA 141 142  K 3.6 3.5  CL 105 105  CO2  --  29  GLUCOSE 86 81  BUN 21* 14  CREATININE 1.10 0.95  CALCIUM  --  8.5*   GFR Estimated Creatinine Clearance: 49.2 mL/min (by C-G formula based on SCr of 0.95 mg/dL). Liver Function Tests:  Recent  Labs Lab 07/17/16 1118  AST 23  ALT 9*  ALKPHOS 43  BILITOT 0.8  PROT 6.3*  ALBUMIN 3.7    Recent Labs Lab 07/17/16 1118  LIPASE 44   No results for input(s): AMMONIA in the last 168 hours. Coagulation profile  Recent Labs Lab 07/16/16 1745 07/17/16 0729 07/18/16 0553  INR 1.79 1.99 1.53    CBC:  Recent Labs Lab 07/16/16 1753 07/16/16 1803 07/17/16 0729  WBC 8.1  --  7.2  NEUTROABS 4.5  --   --   HGB 13.8 13.9 13.1  HCT 40.8 41.0 39.0  MCV 91.9  --  92.2  PLT 176  --  157   Cardiac Enzymes:  Recent Labs Lab 07/16/16 1745 07/16/16 2133  TROPONINI 0.03* <0.03   BNP: Invalid input(s): POCBNP CBG: No results for input(s): GLUCAP in the last 168 hours. D-Dimer No results for input(s): DDIMER in the last 72 hours. Hgb A1c No results for input(s): HGBA1C in the last 72 hours. Lipid Profile No results for input(s): CHOL, HDL, LDLCALC, TRIG, CHOLHDL, LDLDIRECT in the last 72 hours. Thyroid function studies  Recent Labs  07/16/16 2133  TSH 2.446   Anemia work up No results for input(s): VITAMINB12,  FOLATE, FERRITIN, TIBC, IRON, RETICCTPCT in the last 72 hours. Microbiology No results found for this or any previous visit (from the past 240 hour(s)).   Discharge Instructions:   Discharge Instructions    Discharge instructions    Complete by:  As directed    DYS 3 thin diet Follow INR Consider palliative care referral DNR   Increase activity slowly    Complete by:  As directed      Allergies as of 07/18/2016      Reactions   Erythromycin Base Other (See Comments)   Unknown   Penicillins Hives, Other (See Comments)   REACTION: Severe Hives      Medication List    STOP taking these medications   naproxen sodium 220 MG tablet Commonly known as:  ANAPROX     TAKE these medications   amLODipine 10 MG tablet Commonly known as:  NORVASC Take 1 tablet (10 mg total) by mouth daily. What changed:  how much to take   aspirin 81 MG  tablet Take 81 mg by mouth daily.   donepezil 5 MG tablet Commonly known as:  ARICEPT Take 4 tablets (20 mg total) by mouth at bedtime.   feeding supplement (ENSURE ENLIVE) Liqd Take 237 mLs by mouth 2 (two) times daily between meals.   folic acid 1 MG tablet Commonly known as:  FOLVITE Take 1 mg by mouth daily.   mirtazapine 7.5 MG tablet Commonly known as:  REMERON Take 7.5 mg by mouth daily.   OMEGA 3 PO Take 1 capsule by mouth daily. Reported on 05/17/2015   OSTEO BI-FLEX REGULAR STRENGTH PO Take 1 tablet by mouth daily.   pantoprazole 40 MG tablet Commonly known as:  PROTONIX Take 1 tablet (40 mg total) by mouth daily.   simvastatin 20 MG tablet Commonly known as:  ZOCOR Take 1 tablet (20 mg total) by mouth daily at 6 PM.   warfarin 5 MG tablet Commonly known as:  COUMADIN Take as directed by coumadin clinic What changed:  how much to take  how to take this  when to take this  additional instructions      Contact information for after-discharge care    Destination    [facility] .   Specialty:  Skilled Nursing Facility Contact information: [address] [place] [address] [phone]               Time coordinating discharge: 35 min  Signed:  Dequavius Kuhner Juanetta Gosling   [facility] 07/18/2016, 8:15 AM

## 2016-07-18 NOTE — Discharge Planning (Signed)
Daughter called the floor after patient had been discharged and stated her dad had left his phone charger.  She also asked to speak with management- regarding him being discharged before she could travel with him to facility.  Charger located and Administrator, artsloor director notified to call and please discuss situation with daughter.  Contact made.

## 2016-08-14 ENCOUNTER — Ambulatory Visit (INDEPENDENT_AMBULATORY_CARE_PROVIDER_SITE_OTHER): Payer: Medicare Other | Admitting: *Deleted

## 2016-08-14 DIAGNOSIS — I442 Atrioventricular block, complete: Secondary | ICD-10-CM

## 2016-08-14 NOTE — Progress Notes (Signed)
Pacemaker check in clinic. Normal device function. Thresholds, sensing, impedances consistent with previous measurements. Device programmed to maximize longevity. <1% AT/AF + Coumadin. No high ventricular rates noted. Device programmed at appropriate safety margins. Histogram distribution appropriate for patient activity level. Device programmed to optimize intrinsic conduction. Estimated longevity 3.25-4.50 years. ROV w/ JA 11/13/2016. Patient education completed.

## 2016-09-01 ENCOUNTER — Telehealth: Payer: Self-pay | Admitting: Cardiovascular Disease

## 2016-09-01 NOTE — Telephone Encounter (Signed)
New message    Pt daughter is calling asking for a call back. She said nurse that was coming out to see pt told PCP he should stop coumadin because of his fall risk. She states that pt PCP told her to stop pt coumadin. She did not stop the medication until she spoke with his heart doctor.

## 2016-09-01 NOTE — Telephone Encounter (Signed)
I spoke with the pt's daughter Eunice BlaseDebbie and she is upset that the PCP advised that the pt stop coumadin due to fall risk.  She said that there is a reason why the pt is taking this medication and that she feels like cardiology should be involved in the decision making in regards to stopping coumadin. I advised her of the reason why the pt is taking this medication.  The pt is falling a couple of times per week. I made her aware that when a pt is on anticoagulation and they are at an increased risk for fall we have to look at the risk versus the benefits of taking this medication. I educated her about the possibility of stroke when off coumadin vs internal bleeding related to fall while on coumadin.  Eunice BlaseDebbie said she will speak with the pt and try to make a determination about what to do with coumadin.  At this time she plans to continue this medication. [name] would like to transition the pt's INR management to our office if possible due to this office being closer for transportation.  I made [name] aware that I will forward her request to our anticoagulation clinic to see if they can take over managing the pt's coumadin.

## 2016-09-01 NOTE — Telephone Encounter (Signed)
LMOM so that can be scheduled in coumadin clinic.

## 2016-09-06 NOTE — Telephone Encounter (Signed)
Called pt and spoke with his daughter. She would like to set him up for a Coumadin appt next week (has still been taking his Coumadin and had his last check 2-3 weeks ago). She needs to reference her calendar and will call us back later today when she has an available date.

## 2016-09-06 NOTE — Telephone Encounter (Signed)
[name] returned call and new Coumadin appt scheduled for next week.

## 2016-09-11 ENCOUNTER — Ambulatory Visit (INDEPENDENT_AMBULATORY_CARE_PROVIDER_SITE_OTHER): Payer: Medicare Other | Admitting: *Deleted

## 2016-09-11 ENCOUNTER — Telehealth: Payer: Self-pay | Admitting: *Deleted

## 2016-09-11 ENCOUNTER — Ambulatory Visit (HOSPITAL_COMMUNITY): Payer: Medicare Other | Attending: Cardiovascular Disease

## 2016-09-11 ENCOUNTER — Other Ambulatory Visit: Payer: Self-pay

## 2016-09-11 DIAGNOSIS — I480 Paroxysmal atrial fibrillation: Secondary | ICD-10-CM

## 2016-09-11 DIAGNOSIS — I739 Peripheral vascular disease, unspecified: Secondary | ICD-10-CM | POA: Diagnosis not present

## 2016-09-11 DIAGNOSIS — I10 Essential (primary) hypertension: Secondary | ICD-10-CM | POA: Insufficient documentation

## 2016-09-11 DIAGNOSIS — I080 Rheumatic disorders of both mitral and aortic valves: Secondary | ICD-10-CM | POA: Insufficient documentation

## 2016-09-11 DIAGNOSIS — Z8673 Personal history of transient ischemic attack (TIA), and cerebral infarction without residual deficits: Secondary | ICD-10-CM | POA: Insufficient documentation

## 2016-09-11 DIAGNOSIS — I2510 Atherosclerotic heart disease of native coronary artery without angina pectoris: Secondary | ICD-10-CM | POA: Diagnosis not present

## 2016-09-11 DIAGNOSIS — I359 Nonrheumatic aortic valve disorder, unspecified: Secondary | ICD-10-CM | POA: Diagnosis not present

## 2016-09-11 DIAGNOSIS — I350 Nonrheumatic aortic (valve) stenosis: Secondary | ICD-10-CM

## 2016-09-11 DIAGNOSIS — R001 Bradycardia, unspecified: Secondary | ICD-10-CM | POA: Diagnosis not present

## 2016-09-11 DIAGNOSIS — Z5181 Encounter for therapeutic drug level monitoring: Secondary | ICD-10-CM | POA: Diagnosis not present

## 2016-09-11 DIAGNOSIS — I4891 Unspecified atrial fibrillation: Secondary | ICD-10-CM | POA: Insufficient documentation

## 2016-09-11 LAB — ECHOCARDIOGRAM COMPLETE
AO mean calculated velocity dopler: 302 cm/s
AOPV: 0.18 m/s
AOVTI: 104 cm
AV Area VTI: 0.67 cm2
AV Peak grad: 84 mmHg
AV VEL mean LVOT/AV: 0.18
AV area mean vel ind: 0.4 cm2/m2
AV vel: 0.71
AVA: 0.71 cm2
AVAREAMEANV: 0.68 cm2
AVAREAVTIIND: 0.42 cm2/m2
AVG: 41 mmHg
AVPHT: 470 ms
AVPKVEL: 457 cm/s
CHL CUP AV PEAK INDEX: 0.4
CHL CUP AV VALUE AREA INDEX: 0.42
CHL CUP DOP CALC LVOT VTI: 19.3 cm
CHL CUP MV DEC (S): 303
E decel time: 303 msec
EERAT: 7.94
FS: 25 % — AB (ref 28–44)
IVS/LV PW RATIO, ED: 0.67
LA ID, A-P, ES: 29 mm
LA diam index: 1.72 cm/m2
LA vol A4C: 36.4 ml
LA vol index: 22.1 mL/m2
LAVOL: 37.3 mL
LEFT ATRIUM END SYS DIAM: 29 mm
LV PW d: 9 mm — AB (ref 0.6–1.1)
LV TDI E'MEDIAL: 4.79
LVEEAVG: 7.94
LVEEMED: 7.94
LVELAT: 7.18 cm/s
LVOT area: 3.8 cm2
LVOT diameter: 22 mm
LVOT peak VTI: 0.19 cm
LVOT peak vel: 80.9 cm/s
LVOTSV: 73 mL
Lateral S' vel: 9.79 cm/s
MV pk A vel: 108 m/s
MV pk E vel: 57 m/s
MVAP: 2.47 cm2
P 1/2 time: 89 ms
TAPSE: 12 mm
TDI e' lateral: 7.18

## 2016-09-11 LAB — POCT INR: INR: 4.6

## 2016-09-11 NOTE — Telephone Encounter (Signed)
-----   Message from Michael Cooper, MD sent at 09/11/2016  1:11 PM EDT ----- Reviewed. Severe AS present - no major change from prior. Pt with upcoming appt in September 

## 2016-09-11 NOTE — Telephone Encounter (Signed)
DPR for pt's daughter John Farmer. Pt daughter has been notified of pt's echo results and findings by phone with verbal understanding. John Farmer verbalized understanding for pt to continue on his current Tx plan and to keep his upcoming appt in 11/13/16 with DR. Cooper. John Farmer thanked me for my call today.

## 2016-09-11 NOTE — Telephone Encounter (Signed)
Lmtcb for DPR pt's daughter Gavin PoundDeborah to go over echo results.

## 2016-09-11 NOTE — Telephone Encounter (Signed)
Follow Up:; ° ° °Returning your call. °

## 2016-09-11 NOTE — Telephone Encounter (Signed)
-----   Message from Tonny BollmanMichael Cooper, MD sent at 09/11/2016  1:11 PM EDT ----- Reviewed. Severe AS present - no major change from prior. Pt with upcoming appt in September

## 2016-09-18 ENCOUNTER — Ambulatory Visit (INDEPENDENT_AMBULATORY_CARE_PROVIDER_SITE_OTHER): Payer: Medicare Other | Admitting: *Deleted

## 2016-09-18 DIAGNOSIS — I480 Paroxysmal atrial fibrillation: Secondary | ICD-10-CM

## 2016-09-18 DIAGNOSIS — Z5181 Encounter for therapeutic drug level monitoring: Secondary | ICD-10-CM | POA: Diagnosis not present

## 2016-09-18 LAB — POCT INR: INR: 2

## 2016-09-29 ENCOUNTER — Telehealth: Payer: Self-pay

## 2016-09-29 ENCOUNTER — Ambulatory Visit (INDEPENDENT_AMBULATORY_CARE_PROVIDER_SITE_OTHER): Payer: Medicare Other

## 2016-09-29 DIAGNOSIS — Z5181 Encounter for therapeutic drug level monitoring: Secondary | ICD-10-CM

## 2016-09-29 DIAGNOSIS — I480 Paroxysmal atrial fibrillation: Secondary | ICD-10-CM

## 2016-09-29 LAB — POCT INR: INR: 1.6

## 2016-09-29 NOTE — Telephone Encounter (Signed)
Pt's daughter Tereso NewcomerDebbie Farmer expressed concern about pt being on anticoagulant given frequency of falls.  Also concerned about variable INR's, if pt needs to be on anticoagulant and Dr Excell Seltzerooper is not ok with him stopping anticoagulation, she is inquiring about pt switching to DOAC.  She states pt can get rx from [facility], therefor cost will not be an issue.    On a side note pt's daughter inquired about dx assoc with anticoagulation, I advised her pt has afib and hx of TIA per chart, she states to her knowledge pt has never had a TIA and he has lived with her x 15 years.    Please advise if OK to d/c Coumadin or switch to DOAC.  Thanks

## 2016-10-01 NOTE — Telephone Encounter (Signed)
At last office check, he only had 4 hours and 55 minutes of AF over the previous year.  Unfortunately with this device, there is no option for remote monitoring, so if his AF burden increased, we would not know until next in-office check.  Forwarding to Dr Johney FrameAllred to review.

## 2016-10-01 NOTE — Telephone Encounter (Signed)
I'm fine to switch him to a DOAC. However, I'm going to ask Amber and Dr Johney FrameAllred to look at his recent device checks - it looks like he's had <1% mode switching over the last few years and I wonder if we should keep anticoagulating him if he's falling frequently. Will FU this week - thx

## 2016-10-02 NOTE — Telephone Encounter (Signed)
I agree

## 2016-10-02 NOTE — Telephone Encounter (Signed)
Considering all factors including his dementia and declining health with such a low burden of atrial fibrillation it seems like his fall risk and subsequent bleeding risk outweigh the potential benefit of anticoagulation. I would recommend stopping warfarin and going to aspirin 81 mg daily.

## 2016-10-02 NOTE — Telephone Encounter (Signed)
Left message on machine for pt's daughter to contact the office.   

## 2016-10-03 NOTE — Telephone Encounter (Signed)
Follow up ° ° ° °Pt daughter is returning call °

## 2016-10-03 NOTE — Telephone Encounter (Signed)
I spoke with John Farmer and made her aware of Dr Earmon Phoenixooper's recommendation. John Farmer is in agreement to stop warfarin and start ASA 81mg  daily.  I will make the anticoagulation clinic aware and forward a note to the pt's PCP Dr Andrey CampanileWilson.

## 2016-10-04 ENCOUNTER — Encounter: Payer: Self-pay | Admitting: Pharmacist

## 2016-10-04 NOTE — Progress Notes (Signed)
This encounter was created in error - please disregard.

## 2016-10-24 ENCOUNTER — Encounter: Payer: Self-pay | Admitting: *Deleted

## 2016-11-08 ENCOUNTER — Telehealth: Payer: Self-pay | Admitting: Cardiovascular Disease

## 2016-11-08 NOTE — Telephone Encounter (Signed)
New Message  Pt daughter call requesting to speak with Medical Records. Please call back to discuss

## 2016-11-09 NOTE — Telephone Encounter (Signed)
Left VM for Daughter to call me back.

## 2016-11-13 ENCOUNTER — Encounter: Payer: Medicare Other | Admitting: Internal Medicine

## 2016-11-13 ENCOUNTER — Ambulatory Visit: Payer: Medicare Other | Admitting: Cardiovascular Disease

## 2017-03-27 ENCOUNTER — Emergency Department (HOSPITAL_COMMUNITY)
Admission: EM | Admit: 2017-03-27 | Discharge: 2017-03-27 | Disposition: A | Discharge status: Home / Self Care | Payer: Medicare Other | Attending: Emergency Medicine | Admitting: Emergency Medicine

## 2017-03-27 ENCOUNTER — Emergency Department (HOSPITAL_COMMUNITY): Payer: Medicare Other

## 2017-03-27 ENCOUNTER — Other Ambulatory Visit: Payer: Self-pay

## 2017-03-27 ENCOUNTER — Encounter (HOSPITAL_COMMUNITY): Payer: Self-pay | Admitting: *Deleted

## 2017-03-27 DIAGNOSIS — F0390 Unspecified dementia without behavioral disturbance: Secondary | ICD-10-CM | POA: Insufficient documentation

## 2017-03-27 DIAGNOSIS — Z79899 Other long term (current) drug therapy: Secondary | ICD-10-CM | POA: Diagnosis not present

## 2017-03-27 DIAGNOSIS — Z7982 Long term (current) use of aspirin: Secondary | ICD-10-CM | POA: Diagnosis not present

## 2017-03-27 DIAGNOSIS — Z950 Presence of cardiac pacemaker: Secondary | ICD-10-CM | POA: Diagnosis not present

## 2017-03-27 DIAGNOSIS — I10 Essential (primary) hypertension: Secondary | ICD-10-CM | POA: Diagnosis not present

## 2017-03-27 DIAGNOSIS — R4182 Altered mental status, unspecified: Secondary | ICD-10-CM | POA: Diagnosis present

## 2017-03-27 DIAGNOSIS — I2510 Atherosclerotic heart disease of native coronary artery without angina pectoris: Secondary | ICD-10-CM | POA: Diagnosis not present

## 2017-03-27 DIAGNOSIS — N390 Urinary tract infection, site not specified: Secondary | ICD-10-CM | POA: Diagnosis not present

## 2017-03-27 DIAGNOSIS — R410 Disorientation, unspecified: Secondary | ICD-10-CM | POA: Insufficient documentation

## 2017-03-27 DIAGNOSIS — J45909 Unspecified asthma, uncomplicated: Secondary | ICD-10-CM | POA: Insufficient documentation

## 2017-03-27 DIAGNOSIS — F17210 Nicotine dependence, cigarettes, uncomplicated: Secondary | ICD-10-CM | POA: Insufficient documentation

## 2017-03-27 LAB — COMPREHENSIVE METABOLIC PANEL
ALT: 16 U/L — ABNORMAL LOW (ref 17–63)
AST: 24 U/L (ref 15–41)
Albumin: 3.4 g/dL — ABNORMAL LOW (ref 3.5–5.0)
Alkaline Phosphatase: 56 U/L (ref 38–126)
Anion gap: 10 (ref 5–15)
BILIRUBIN TOTAL: 0.9 mg/dL (ref 0.3–1.2)
BUN: 10 mg/dL (ref 6–20)
CO2: 27 mmol/L (ref 22–32)
CREATININE: 1.16 mg/dL (ref 0.61–1.24)
Calcium: 8.7 mg/dL — ABNORMAL LOW (ref 8.9–10.3)
Chloride: 103 mmol/L (ref 101–111)
GFR calc Af Amer: >60 mL/min (ref >60)
GFR, EST NON AFRICAN AMERICAN: 56 mL/min — AB (ref >60)
Glucose, Bld: 81 mg/dL (ref 65–99)
POTASSIUM: 3.8 mmol/L (ref 3.5–5.1)
Sodium: 140 mmol/L (ref 135–145)
TOTAL PROTEIN: 6.3 g/dL — AB (ref 6.5–8.1)

## 2017-03-27 LAB — URINALYSIS, ROUTINE W REFLEX MICROSCOPIC
Bilirubin Urine: NEGATIVE
GLUCOSE, UA: NEGATIVE mg/dL
Ketones, ur: NEGATIVE mg/dL
NITRITE: NEGATIVE
PH: 6 (ref 5.0–8.0)
Protein, ur: 100 mg/dL — AB
SPECIFIC GRAVITY, URINE: 1.014 (ref 1.005–1.030)
Squamous Epithelial / LPF: NONE SEEN

## 2017-03-27 LAB — CBC
HEMATOCRIT: 42.5 % (ref 39.0–52.0)
Hemoglobin: 14.5 g/dL (ref 13.0–17.0)
MCH: 31.9 pg (ref 26.0–34.0)
MCHC: 34.1 g/dL (ref 30.0–36.0)
MCV: 93.6 fL (ref 78.0–100.0)
PLATELETS: 181 10*3/uL (ref 150–400)
RBC: 4.54 MIL/uL (ref 4.22–5.81)
RDW: 14.2 % (ref 11.5–15.5)
WBC: 8.7 10*3/uL (ref 4.0–10.5)

## 2017-03-27 LAB — CBG MONITORING, ED: Glucose-Capillary: 78 mg/dL (ref 65–99)

## 2017-03-27 MED ORDER — CEPHALEXIN 500 MG PO CAPS: 500.0000 mg | ORAL_CAPSULE | Freq: Two times a day (BID) | ORAL | 0 refills | Status: AC

## 2017-03-27 MED ORDER — CEPHALEXIN 250 MG PO CAPS
500.0000 mg | ORAL_CAPSULE | Freq: Once | ORAL | Status: AC
  Administered 2017-03-27: 500 mg via ORAL
  Filled 2017-03-27: qty 2

## 2017-03-27 NOTE — ED Notes (Signed)
ED Provider at bedside. 

## 2017-03-27 NOTE — ED Notes (Signed)
Patient transported to CT 

## 2017-03-27 NOTE — ED Notes (Signed)
CBG 78. 

## 2017-03-27 NOTE — Discharge Instructions (Signed)
RETURN TO ER IF ANY FEVERS, VOMITING, ABDOMINAL PAIN, OR WORSENING CONFUSION.

## 2017-03-27 NOTE — ED Notes (Signed)
Patient presents to ed with daughter , of which she states she was at work this am and his caretaker hadn't gotten there yet, neighbors saw patient walking down the road and called the daughter , she states he has dementia and she is normally able to talk to him on the phone and get him reoriented, however today she couldn't . At ED patient is alert , oriented to placed and name. Daughter at bedside . Patient denies pain.

## 2017-03-27 NOTE — ED Triage Notes (Signed)
To ED for eval of AMS. Pt daughter states she came to work this am around 0600. Pt has a caregiver come to the home around 0900. She received a call from her neighbor that pt was walking down the street. She states she tried to talk to her dad who told her he was taking his motorcycle for a ride. Pt doesn't have motorcycle. Hx dementia. Pt aware of self and situation. Pt has hx of silver alert.

## 2017-03-27 NOTE — ED Notes (Signed)
Patient verbalizes understanding of discharge instructions. Opportunity for questioning and answers were provided. Armband removed by staff, pt discharged from ED ambulatory.   

## 2017-03-27 NOTE — ED Notes (Signed)
Pt provided with PO, tolerated well.

## 2017-03-27 NOTE — ED Provider Notes (Signed)
[facility] EMERGENCY DEPARTMENT Provider Note   CSN: [id number] Arrival date & time: 03/27/17  1003     History   Chief Complaint Chief Complaint  Patient presents with  . Altered Mental Status    HPI John Farmer is a 84 y.o. male.  84 year old male with past medical history including CAD, PVD, pacemaker, dementia who presents with altered mental status.  Daughter states that he was in his usual state of health yesterday.  He lives with her and they have caregivers during the day but there is a short period of time in the morning where he is alone.  She left for work around 6 AM.  She received a call later in the morning from a neighbor who said that the patient was walking down the street and had told them that he was trying to take his motorcycle for a ride.  Daughter notes that he has never had a motorcycle.  She immediately rushed home and still seemed confused and was not acting himself.  She states he has not had any fevers, vomiting, diarrhea, cough/cold symptoms, or recent illness.  She does note that she reviewed cameras in her house and before he went walking he was watching TV as usual.  He currently denies any pain or breathing problems.  LEVEL5 CAVEAT DUE TO DEMENTIA   The history is provided by a relative.  Altered Mental Status      Past Medical History:  Diagnosis Date  . CAD (coronary artery disease)   . Complete heart block (HCC) 12/05/07   s/p PPM  . ED (erectile dysfunction)   . HLD (hyperlipidemia)   . HTN (hypertension)   . Moderate asthma   . 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 Tobacco abuse   . Tobacco abuse     Patient Active Problem List   Diagnosis Date Noted  . Encounter for therapeutic drug monitoring 09/11/2016  . Dementia 07/17/2016  . Weight loss 07/17/2016  . Protein-calorie malnutrition, severe 07/17/2016  . Falls frequently  07/16/2016  . Long term current use of anticoagulant 09/06/2012  . Paroxysmal atrial fibrillation (HCC) 09/02/2012  . PVD 09/01/2009  . Aortic valve disorder 11/27/2008  . ABDOMINAL ANEURYSM WITHOUT MENTION OF RUPTURE 11/27/2008  . Complete heart block (HCC) 04/13/2008  . BRADYCARDIA 11/11/2007  . CERUMEN IMPACTION, BILATERAL 08/06/2007  . TOBACCO ABUSE 11/15/2006  . CORONARY ARTERY DISEASE 11/15/2006  . HYPERTENSION 10/12/2006  . TRANSIENT ISCHEMIC ATTACK, HX OF 10/12/2006    Past Surgical History:  Procedure Laterality Date  . lumbar hnp    . PACEMAKER INSERTION  12/05/07   by Fawn Kirk  . TONSILLECTOMY         Home Medications    Prior to Admission medications   Medication Sig Start Date End Date Taking? Authorizing Provider  amLODipine (NORVASC) 10 MG tablet Take 1 tablet (10 mg total) by mouth daily. Patient taking differently: Take 5 mg by mouth daily.  11/17/13   Tonny Bollman, MD  aspirin 81 MG tablet Take 81 mg by mouth daily.      [provider]  cephALEXin (KEFLEX) 500 MG capsule Take 1 capsule (500 mg total) by mouth 2 (two) times daily for 7 days. 03/27/17 04/03/17  Ford Peddie, Ambrose Finland, MD  donepezil (ARICEPT) 5 MG tablet Take 4 tablets (20 mg total) by mouth at bedtime. 07/18/16   Joseph Art, DO  feeding supplement,  ENSURE ENLIVE, (ENSURE ENLIVE) LIQD Take 237 mLs by mouth 2 (two) times daily between meals. 07/18/16   Joseph Art, DO  folic acid (FOLVITE) 1 MG tablet Take 1 mg by mouth daily.    [provider]  Glucosamine-Chondroitin (OSTEO BI-FLEX REGULAR STRENGTH PO) Take 1 tablet by mouth daily.     [provider]  mirtazapine (REMERON) 7.5 MG tablet Take 7.5 mg by mouth daily.     [provider]  Omega-3 Fatty Acids (OMEGA 3 PO) Take 1 capsule by mouth daily. Reported on 05/17/2015    [provider]  pantoprazole (PROTONIX) 40 MG tablet Take 1 tablet (40 mg total) by mouth daily. 07/18/16   Joseph Art, DO    simvastatin (ZOCOR) 20 MG tablet Take 1 tablet (20 mg total) by mouth daily at 6 PM. 11/17/13   Tonny Bollman, MD    Family History Family History  Problem Relation Age of Onset  . Dementia Mother   . Coronary artery disease Other        male 1st degree relative <60  . Skin cancer Other   . Stroke Other        F  1st degree relative <60; M 1st degree relative <50  . Thyroid disease Other   . Heart attack Neg Hx   . Hypertension Neg Hx     Social History Social History   Tobacco Use  . Smoking status: Current Every Day Smoker  . Smokeless tobacco: Never Used  . Tobacco comment: Smoked 1 ppd for more than 60 years, very clear that he will not quit.  Substance Use Topics  . Alcohol use: Yes    Alcohol/week: 0.0 oz    Comment: scotch  . Drug use: No     Allergies   Erythromycin base and Penicillins   Review of Systems Review of Systems  Unable to perform ROS: Dementia     Physical Exam Updated Vital Signs BP 133/75   Pulse 62   Temp 98.3 F (36.8 C) (Oral)   Resp 15   Ht 5' 7.5" (1.715 m)   Wt 59 kg (130 lb)   SpO2 97%   BMI 20.06 kg/m   Physical Exam  Constitutional: He appears well-developed and well-nourished. No distress.  HENT:  Head: Normocephalic and atraumatic.  Moist mucous membranes  Eyes: Conjunctivae are normal. Pupils are equal, round, and reactive to light.  Neck: Neck supple.  Cardiovascular: Normal rate and regular rhythm.  Murmur heard. Pulmonary/Chest: Effort normal and breath sounds normal.  Abdominal: Soft. Bowel sounds are normal. He exhibits no distension. There is no tenderness.  Musculoskeletal: He exhibits no edema.  Neurological: He is alert.  Fluent speech, oriented to person and place, following basic commands, normal sensation, 5/5 strength x all 4 ext  Skin: Skin is warm and dry.  Nursing note and vitals reviewed.    ED Treatments / Results  Labs (all labs ordered are listed, but only abnormal results are  displayed) Labs Reviewed  COMPREHENSIVE METABOLIC PANEL - Abnormal; Notable for the following components:      Result Value   Calcium 8.7 (*)    Total Protein 6.3 (*)    Albumin 3.4 (*)    ALT 16 (*)    GFR calc non Af Amer 56 (*)    All other components within normal limits  URINALYSIS, ROUTINE W REFLEX MICROSCOPIC - Abnormal; Notable for the following components:   APPearance CLOUDY (*)    Hgb  urine dipstick MODERATE (*)    Protein, ur 100 (*)    Leukocytes, UA LARGE (*)    Bacteria, UA MANY (*)    All other components within normal limits  URINE CULTURE  CBC  CBG MONITORING, ED  CBG MONITORING, ED    EKG  EKG Interpretation None       Radiology Ct Head Wo Contrast  Result Date: 03/27/2017 CLINICAL DATA:  Altered level of consciousness. EXAM: CT HEAD WITHOUT CONTRAST TECHNIQUE: Contiguous axial images were obtained from the base of the skull through the vertex without intravenous contrast. COMPARISON:  07/16/2016 FINDINGS: Brain: No evidence of acute infarction, hemorrhage, hydrocephalus, extra-axial collection or mass lesion/mass effect. Advanced atrophy that is generalized. Chronic small vessel ischemic type change in the cerebral white matter, confluent around the lateral ventricles. Vascular: Atherosclerotic calcification. Skull: No acute or aggressive finding. Sinuses/Orbits: Negative IMPRESSION: 1. No acute finding. 2. Atrophy and chronic small vessel ischemia. Stable scan compared to 2018. Electronically Signed   By: Marnee SpringJonathon  Watts M.D.   On: 03/27/2017 11:30    Procedures Procedures (including critical care time)  Medications Ordered in ED Medications  cephALEXin (KEFLEX) capsule 500 mg (500 mg Oral Given 03/27/17 1324)     Initial Impression / Assessment and Plan / ED Course  I have reviewed the triage vital signs and the nursing notes.  Pertinent labs & imaging results that were available during my care of the patient were reviewed by me and considered in  my medical decision making (see chart for details).     Pt comfortable and well appearing on exam, VS normal. No weakness or focal neuro deficits aside from disorientation to time. His labs show reassuring CMP and CBC,  Head CT negative acute. UA c/w infection, w/ large leuks, TNTC WBC and bacteria. Gave keflex.  He has no signs/sx of systemic infection, acute intracranial process, or metabolic derangement. I have recommended close f/u with PCP and not leaving patient home alone as I suspect his dementia is advancing to the point that it is unsafe for him to be unattended.  Return precautions extensively reviewed including fever, lethargy, complaints of abdominal or chest pain, breathing problems, or sudden changes in symptoms.  Patient well-appearing at time of discharge and discharged in satisfactory condition.  Final Clinical Impressions(s) / ED Diagnoses   Final diagnoses:  Urinary tract infection without hematuria, site unspecified  Confusion    ED Discharge Orders        Ordered    cephALEXin (KEFLEX) 500 MG capsule  2 times daily     03/27/17 1411       Verdene Creson, Ambrose Finlandachel Morgan, MD 03/27/17 1414

## 2017-03-27 NOTE — ED Notes (Signed)
Attempted to obtain urine specimen; Pt unable to provide one at this time 

## 2017-03-29 LAB — URINE CULTURE: Special Requests: NORMAL

## 2017-03-30 ENCOUNTER — Telehealth: Payer: Self-pay

## 2017-03-30 NOTE — Telephone Encounter (Signed)
Post ED Visit - Positive Culture Follow-up  Culture report reviewed by antimicrobial stewardship pharmacist:  []  John Farmer, Pharm.D. []  John Farmer, Pharm.D., BCPS AQ-ID [x]  John Farmer, Pharm.D., BCPS []  John Farmer, Pharm.D., BCPS []  John Farmer, [address]Pharm.D., BCPS, AAHIVP []  John Farmer, Pharm.D., BCPS, AAHIVP []  John Farmer, PharmD, BCPS []  John Farmer, PharmD []  John Farmer, PharmD, BCPS  Positive urine culture Treated with Cephalexin, organism sensitive to the same and no further patient follow-up is required at this time.  John Farmer, John Farmer 03/30/2017, 9:55 AM

## 2018-02-04 IMAGING — CT CT HEAD W/O CM
4 series · 16 of 47 positions shown, 18 images · non-contrast
Comparison: 07/16/2016

CLINICAL DATA: Altered level of consciousness.

EXAM:
CT HEAD WITHOUT CONTRAST
TECHNIQUE: Contiguous axial images were obtained from the base of the skull
through the vertex without intravenous contrast.

[Series 3: head wo · axial · 0.45mm/px · z∈[-104,+16]mm · 7 of 34 slices shown, 9 images]
[im 5/34  brain]
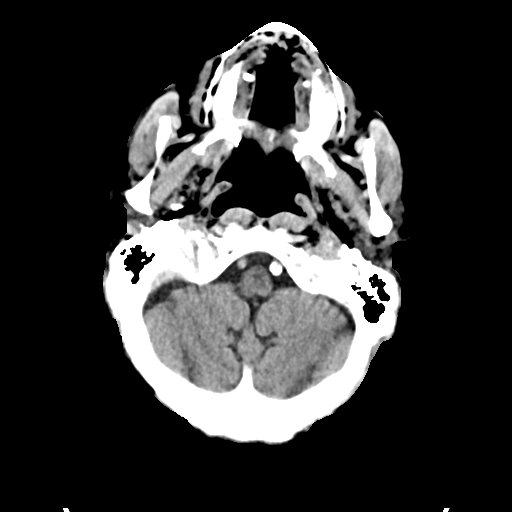
[im 5/34  bone]
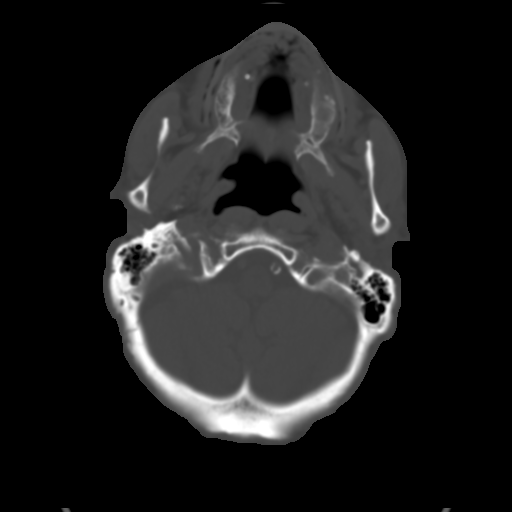
[im 9/34  brain]
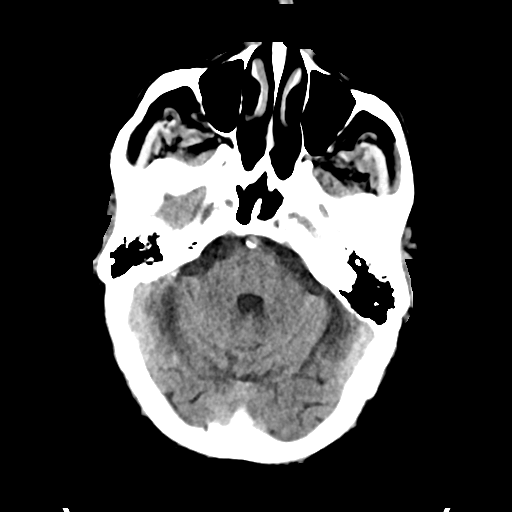
[im 13/34  brain]
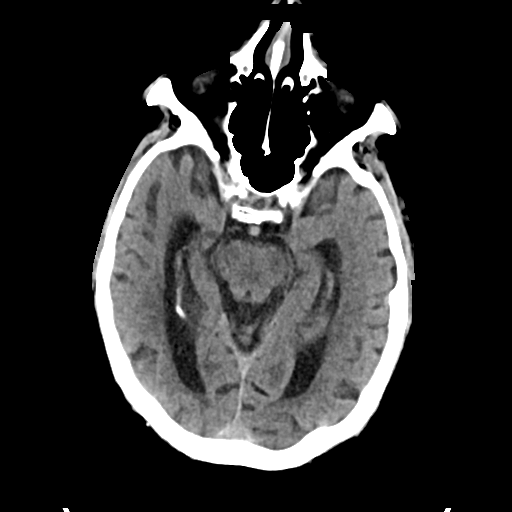
[im 17/34  brain]
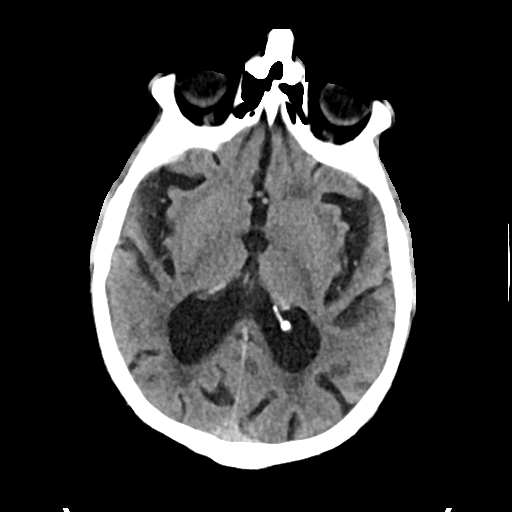
[im 21/34  brain]
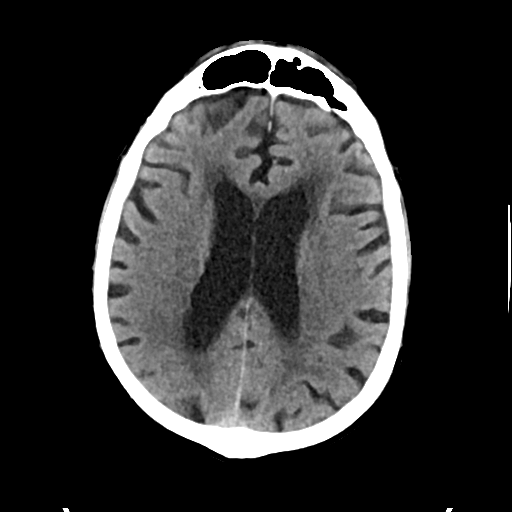
[im 21/34  bone]
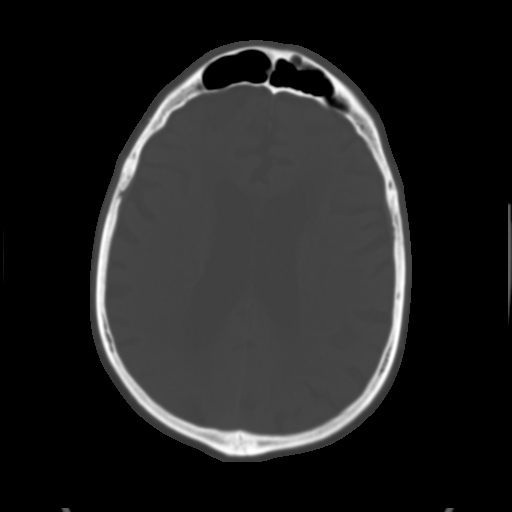
[im 25/34  brain]
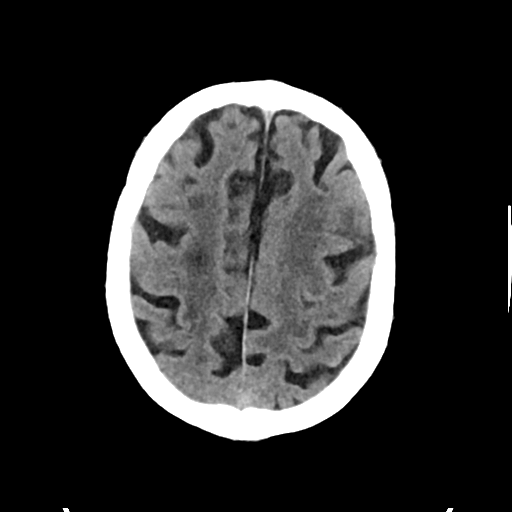
[im 29/34  brain]
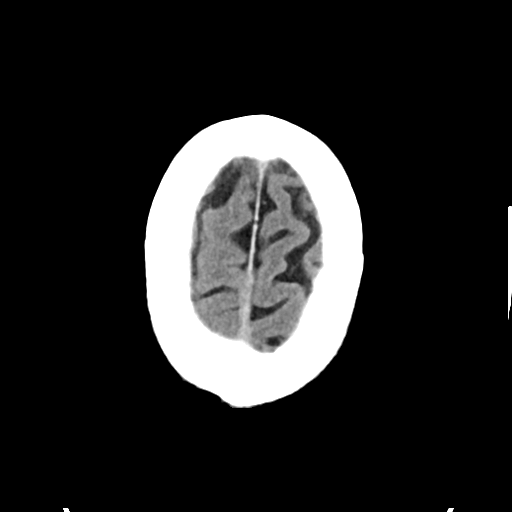

[Series 4: head bone · axial · 0.45mm/px · z∈[-108,-74]mm · 3 of 85 slices shown]
[im 9/85  bone]
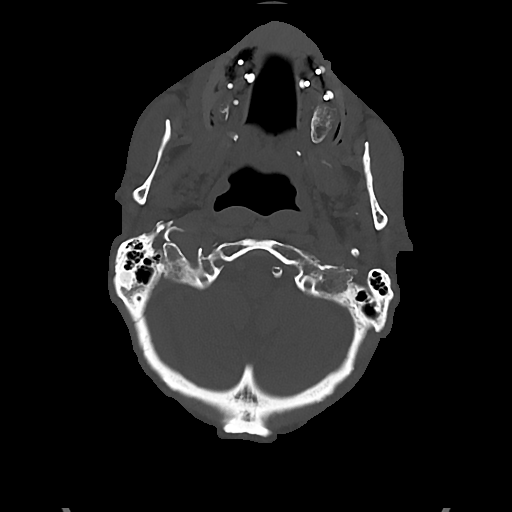
[im 17/85  bone]
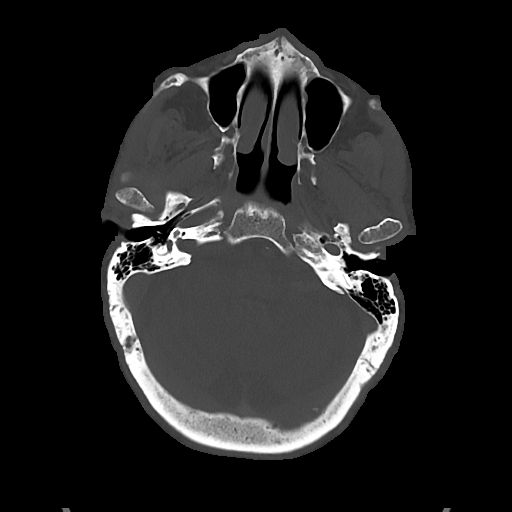
[im 26/85  bone]
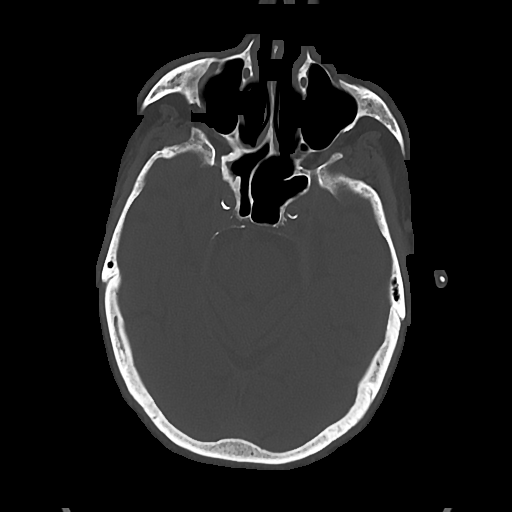

[Series 5: cor soft · coronal · 0.32mm/px · 3 of 74 slices shown]
[im 25/74  brain]
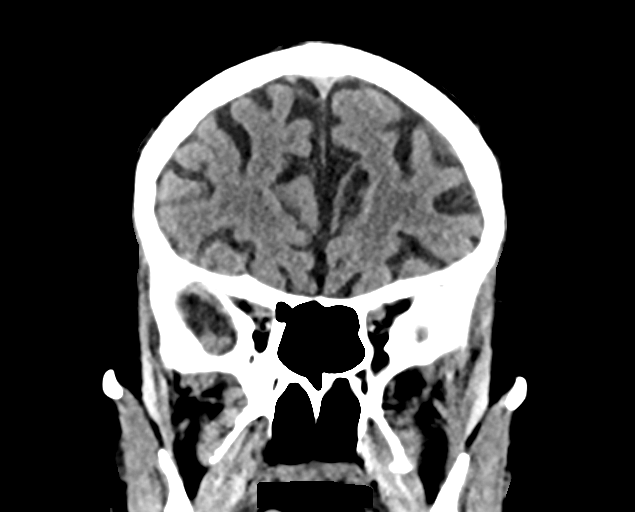
[im 33/74  brain]
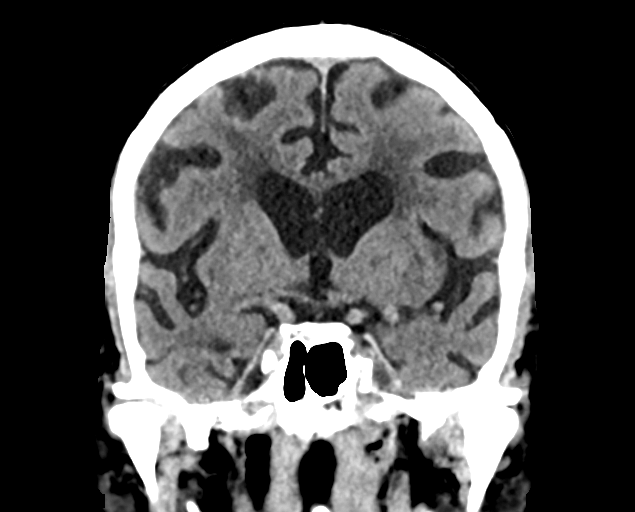
[im 41/74  brain]
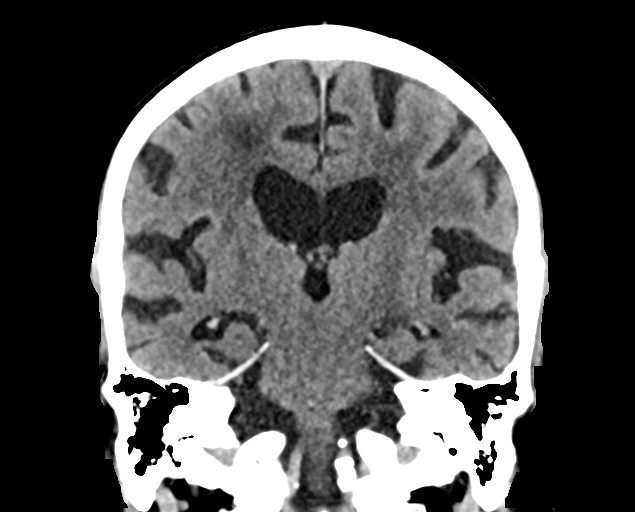

[Series 6: sag soft · sagittal · 0.33mm/px · 3 of 67 slices shown]
[im 23/67  brain]
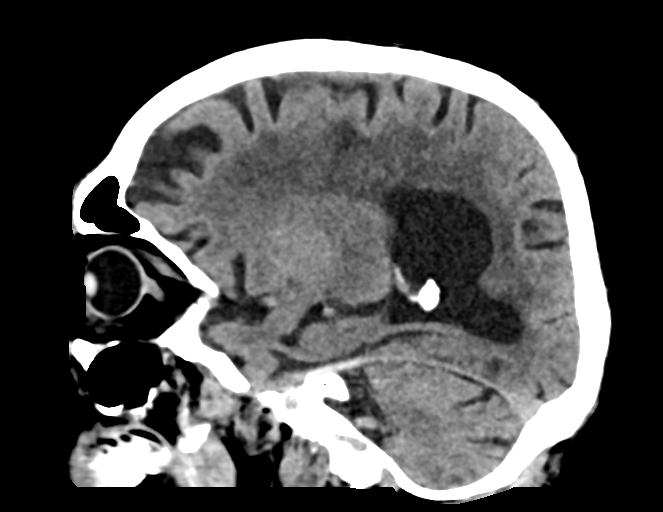
[im 34/67  brain]
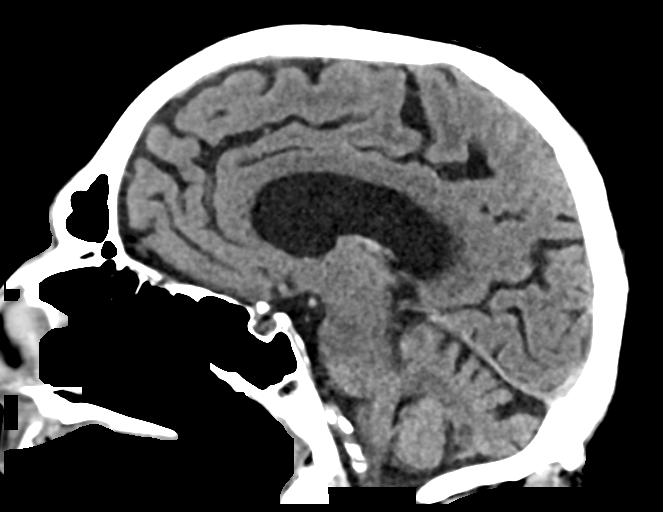
[im 45/67  brain]
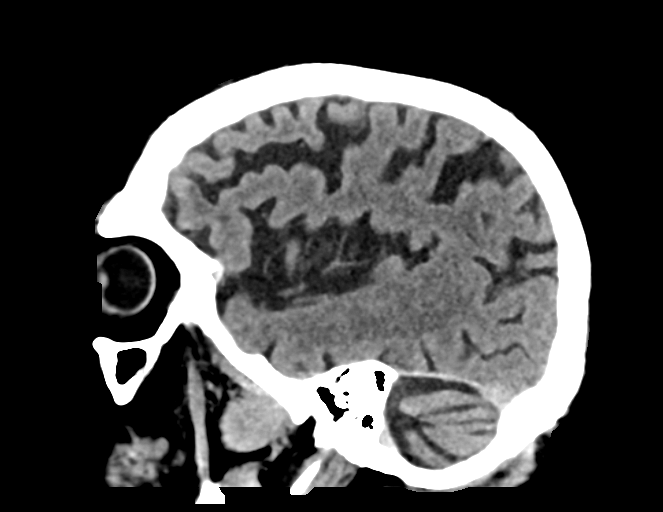

[16 of 47 positions shown; findings below may reference images not displayed]

FINDINGS: Brain: No evidence of acute infarction, hemorrhage, hydrocephalus,
extra-axial collection or mass lesion/mass effect. Advanced atrophy
that is generalized. Chronic small vessel ischemic type change in
the cerebral white matter, confluent around the lateral ventricles.

Vascular: Atherosclerotic calcification.

Skull: No acute or aggressive finding.

Sinuses/Orbits: Negative
IMPRESSION: 1. No acute finding.
2. Atrophy and chronic small vessel ischemia. Stable scan compared
to 5399.

## 2018-11-15 ENCOUNTER — Telehealth: Payer: Self-pay

## 2018-11-15 DIAGNOSIS — I442 Atrioventricular block, complete: Secondary | ICD-10-CM

## 2018-11-15 NOTE — Telephone Encounter (Signed)
The [facility]  [facility]. I let her talk with the device nurse [place].

## 2018-11-15 NOTE — Telephone Encounter (Signed)
[facility]  [facility] [place] [phone] EXT [id number], SENT REFERRAL TO SCHEDULING 

## 2018-11-15 NOTE — Telephone Encounter (Signed)
Spoke with [employer] DPR) and she reports patient is being by [facility] and has reached EI per the clinic visit on 11/14/2018. Patient will be scheduled to come in for visit with Dr Rayann Heman to reestablish care and schedule gen change . Patient has Alzheimers and daughter handles all appointments.  Will contact [facility] for records and info.

## 2018-11-15 NOTE — Telephone Encounter (Signed)
LMOM to contact DC at [facility] to get records  for patient who is at [facility] per family.

## 2018-11-15 NOTE — Telephone Encounter (Signed)
Spoke with pts daughter regarding his appt on 11/18/18. Pts daughter stated she would rather do a facetime. Pts daughter stated she will check pts vitals prior to appt. Pts daughter was advise to call me if she has any further questions.

## 2018-11-15 NOTE — Telephone Encounter (Signed)
Pt scheduled for virtual visit with Dr. Rayann Heman on 11/18/2018.  Pt scheduled for labs and covid test on 11/18/2018  Pt scheduled for gen change 11/21/2018 at 3:30 pm.   All work up complete and instruction letter left for Pt pick up on 11/18/2018

## 2018-11-15 NOTE — Telephone Encounter (Signed)
Daughter notified that gen change will be on  Thursday 11/21/18, not 11/19/18.

## 2018-11-15 NOTE — Telephone Encounter (Signed)
Spoke with John Farmer in device clinic at [facility]. Patient reached ERI 06/24/2018. Will fax records from last 2 device checks. Reported patient is ppm dependent.    Daughter contacted and aware that there will be a virtual visit with Dr Rayann Heman on Monday November 18, 2018 at 11:15. Immediately after completing visit with Dr Rayann Heman patient is to come to [place] office to have labs drawn and preop instructions. Covid testing will be done at [facility] after labs are drawn and Dr Rayann Heman will perform generator change on Tuesday 11/19/2018. [name](daughter) stated she understood instructions. ED precautions given for any new onset or worsening cardiac symptoms.

## 2018-11-18 ENCOUNTER — Other Ambulatory Visit: Payer: Self-pay

## 2018-11-18 ENCOUNTER — Other Ambulatory Visit: Payer: No Typology Code available for payment source | Admitting: *Deleted

## 2018-11-18 ENCOUNTER — Telehealth (INDEPENDENT_AMBULATORY_CARE_PROVIDER_SITE_OTHER): Payer: No Typology Code available for payment source | Admitting: Internal Medicine

## 2018-11-18 ENCOUNTER — Other Ambulatory Visit (HOSPITAL_COMMUNITY)
Admission: RE | Admit: 2018-11-18 | Discharge: 2018-11-18 | Disposition: A | Discharge status: Home / Self Care | Payer: No Typology Code available for payment source | Source: Ambulatory Visit | Attending: Internal Medicine | Admitting: Internal Medicine

## 2018-11-18 ENCOUNTER — Encounter: Payer: Self-pay | Admitting: Internal Medicine

## 2018-11-18 VITALS — BP 97/57 | HR 84 | Ht 67.5 in | Wt 118.0 lb

## 2018-11-18 DIAGNOSIS — I10 Essential (primary) hypertension: Secondary | ICD-10-CM | POA: Diagnosis not present

## 2018-11-18 DIAGNOSIS — Z01812 Encounter for preprocedural laboratory examination: Secondary | ICD-10-CM | POA: Diagnosis present

## 2018-11-18 DIAGNOSIS — I442 Atrioventricular block, complete: Secondary | ICD-10-CM | POA: Diagnosis not present

## 2018-11-18 DIAGNOSIS — Z20828 Contact with and (suspected) exposure to other viral communicable diseases: Secondary | ICD-10-CM | POA: Diagnosis not present

## 2018-11-18 DIAGNOSIS — I4819 Other persistent atrial fibrillation: Secondary | ICD-10-CM

## 2018-11-18 LAB — BASIC METABOLIC PANEL
BUN/Creatinine Ratio: 20 (ref 10–24)
BUN: 21 mg/dL (ref 8–27)
CO2: 29 mmol/L (ref 20–29)
Calcium: 9.2 mg/dL (ref 8.6–10.2)
Chloride: 101 mmol/L (ref 96–106)
Creatinine, Ser: 1.06 mg/dL (ref 0.76–1.27)
GFR calc Af Amer: 73 mL/min/{1.73_m2} (ref >59)
GFR calc non Af Amer: 63 mL/min/{1.73_m2} (ref >59)
Glucose: 121 mg/dL — ABNORMAL HIGH (ref 65–99)
Potassium: 3.9 mmol/L (ref 3.5–5.2)
Sodium: 143 mmol/L (ref 134–144)

## 2018-11-18 LAB — CBC WITH DIFFERENTIAL/PLATELET
Basophils Absolute: 0.1 10*3/uL (ref 0.0–0.2)
Basos: 1 %
EOS (ABSOLUTE): 0.2 10*3/uL (ref 0.0–0.4)
Eos: 2 %
Hematocrit: 41.7 % (ref 37.5–51.0)
Hemoglobin: 14.3 g/dL (ref 13.0–17.7)
Immature Grans (Abs): 0 10*3/uL (ref 0.0–0.1)
Immature Granulocytes: 0 %
Lymphocytes Absolute: 2.4 10*3/uL (ref 0.7–3.1)
Lymphs: 29 %
MCH: 30.9 pg (ref 26.6–33.0)
MCHC: 34.3 g/dL (ref 31.5–35.7)
MCV: 90 fL (ref 79–97)
Monocytes Absolute: 0.8 10*3/uL (ref 0.1–0.9)
Monocytes: 10 %
Neutrophils Absolute: 4.6 10*3/uL (ref 1.4–7.0)
Neutrophils: 58 %
Platelets: 193 10*3/uL (ref 150–450)
RBC: 4.63 x10E6/uL (ref 4.14–5.80)
RDW: 11.9 % (ref 11.6–15.4)
WBC: 8 10*3/uL (ref 3.4–10.8)

## 2018-11-18 NOTE — Progress Notes (Signed)
Electrophysiology TeleHealth Note   Due to national recommendations of social distancing due to COVID 19, Audio telehealth visit is felt to be most appropriate for this patient at this time.  Verbal consent was obtained from the patients daughter (POA) today.   Date:  11/18/2018   ID:  CEFERINO LANG, DOB 1930/12/17, MRN [id number]  Location: home Provider location: [facility] [place] [facility] Performed: New patient consult  PCP:  Delorse Lek, MD  Cardiologist:  [facility] hospital in [place]-    Chief Complaint:  dementia   History of Present Illness:    OSIE AMPARO is a 85 y.o. male who presents via audio/video conferencing for a telehealth visit today.   The patient is referred for new consultation regarding complete heart block by the [facility].  I saw Mr Pickup previously-  my note 2016 is reviewed.   He has been following with the [place] system in [place] since that time. He daughter reports that he has had substantial decline since I saw him last.  His memory is very poor.  He lives at home with her but requires very close supervision.  He has lost weight.  He is not very active.  Today, he denies symptoms of chest pain, shortness of breath,  lower extremity edema, dizziness, syncope, or bleeding.  He is very unstable with walking.  His dementia is advised. The patient is tolerating medications without difficulties and is otherwise without complaint today.   he denies symptoms of cough, fevers, chills, or new SOB worrisome for COVID 19.   Past Medical History:  Diagnosis Date  . CAD (coronary artery disease)   . Complete heart block (HCC) 12/05/07   s/p PPM  . ED (erectile dysfunction)   . HLD (hyperlipidemia)   . HTN (hypertension)   . Moderate asthma   . 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 Tobacco abuse   . Tobacco abuse     Past Surgical History:  Procedure Laterality Date   . lumbar hnp    . PACEMAKER INSERTION  12/05/07   by Fawn Kirk  . TONSILLECTOMY      Current Outpatient Medications  Medication Sig Dispense Refill  . amLODipine (NORVASC) 2.5 MG tablet Take 2.5 mg by mouth daily.    Marland Kitchen donepezil (ARICEPT) 10 MG tablet Take 10 mg by mouth at bedtime.    . feeding supplement, ENSURE ENLIVE, (ENSURE ENLIVE) LIQD Take 237 mLs by mouth 2 (two) times daily between meals. 237 mL 12  . finasteride (PROSCAR) 5 MG tablet Take 5 mg by mouth daily.    . pravastatin (PRAVACHOL) 20 MG tablet Take 20 mg by mouth daily.    . tamsulosin (FLOMAX) 0.4 MG CAPS capsule Take 0.4 mg by mouth daily.     No current facility-administered medications for this visit.     Allergies:   Erythromycin base and Penicillins   Social History:  The patient  reports that he has been smoking. He has never used smokeless tobacco. He reports current alcohol use. He reports that he does not use drugs.   Family History:  The patient's family history includes Coronary artery disease in an other family member; Dementia in his mother; Skin cancer in an other family member; Stroke in an other family member; Thyroid disease in an other family member.    ROS:  Patient is a poor historian and unable to provide   Exam:  Vital Signs:  BP (!) 97/57   Pulse 84   Ht 5' 7.5" (1.715 m)   Wt 118 lb (53.5 kg)   BMI 18.21 kg/m      Labs/Other Tests and Data Reviewed:    Recent Labs: No results found for requested labs within last 8760 hours.   Wt Readings from Last 3 Encounters:  11/18/18 118 lb (53.5 kg)  03/27/17 130 lb (59 kg)  07/16/16 130 lb (59 kg)     Other studies personally reviewed: Additional studies/ records that were reviewed today include: my prior notes,  Device interrogation  Review of the above records today demonstrates: as above  ASSESSMENT & PLAN:    1.  Complete heart block He is dependant and at [facility] since April Risks, benefits, and alternatives to pacemaker pulse  generator replacement were discussed in detail today.  The patient understands that risks include but are not limited to bleeding, infection, pneumothorax, perforation, tamponade, vascular damage, renal failure, MI, stroke, death,  damage to his existing leads, and lead dislodgement and wishes to proceed.  We will therefore schedule the procedure at the next available time.  2. Persistent atrial fibrillation No longer on anticoagulation due to fall risks  3. CAD/PVD Stable No change required today  4. HTN Stable No change required today  5. Moderate AS Conservative manageent  COVID screen The patient does not have any symptoms of COVID.  We will arrange COVID 19 testing prior to his generator change.   Current medicines are reviewed at length with the patient today.   The patient does not have concerns regarding his medicines.  The following changes were made today:  none  Labs/ tests ordered today include:  No orders of the defined types were placed in this encounter.   Patient Risk:  after full review of this patients clinical status, I feel that they are at moderate risk at this time.   Today, I have spent 20 minutes with the patient with telehealth technology discussing pacemaker at [facility] .    SignedThompson Grayer MD, [place] 11/18/2018 11:01 AM   [facility] [address] [place] [address] [phone] (office) [phone] (fax)

## 2018-11-18 NOTE — H&P (View-Only) (Signed)
   Electrophysiology TeleHealth Note   Due to national recommendations of social distancing due to COVID 19, Audio telehealth visit is felt to be most appropriate for this patient at this time.  Verbal consent was obtained from the patients daughter (POA) today.   Date:  11/18/2018   ID:  John Farmer, DOB 09/16/1930, MRN [id number]  Location: home Provider location: [facility] Performed: New patient consult  PCP:  [place], Brent A, MD  [facility] [place]    Chief Complaint:  dementia   History of Present Illness:    John Farmer is a 85 y.o. male who presents via audio/video conferencing for a telehealth visit today.   The patient is referred for new consultation regarding complete heart block by the [facility].  I saw John Farmer previously-  my note 2016 is reviewed.   [facility] [place] [facility]. He daughter reports that he has had substantial decline since I saw him last.  His memory is very poor.  He lives at home with her but requires very close supervision.  He has lost weight.  He is not very active.  Today, he denies symptoms of chest pain, shortness of breath,  lower extremity edema, dizziness, syncope, or bleeding.  He is very unstable with walking.  His dementia is advised. The patient is tolerating medications without difficulties and is otherwise without complaint today.   he denies symptoms of cough, fevers, chills, or new SOB worrisome for COVID 19.   Past Medical History:  Diagnosis Date  . CAD (coronary artery disease)   . Complete heart block (HCC) 12/05/07   s/p PPM  . ED (erectile dysfunction)   . HLD (hyperlipidemia)   . HTN (hypertension)   . Moderate asthma   . 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 Tobacco abuse   . Tobacco abuse     Past Surgical History:  Procedure Laterality Date   . lumbar hnp    . PACEMAKER INSERTION  12/05/07   by JA  . TONSILLECTOMY      Current Outpatient Medications  Medication Sig Dispense Refill  . amLODipine (NORVASC) 2.5 MG tablet Take 2.5 mg by mouth daily.    . donepezil (ARICEPT) 10 MG tablet Take 10 mg by mouth at bedtime.    . feeding supplement, ENSURE ENLIVE, (ENSURE ENLIVE) LIQD Take 237 mLs by mouth 2 (two) times daily between meals. 237 mL 12  . finasteride (PROSCAR) 5 MG tablet Take 5 mg by mouth daily.    . pravastatin (PRAVACHOL) 20 MG tablet Take 20 mg by mouth daily.    . tamsulosin (FLOMAX) 0.4 MG CAPS capsule Take 0.4 mg by mouth daily.     No current facility-administered medications for this visit.     Allergies:   Erythromycin base and Penicillins   Social History:  The patient  reports that he has been smoking. He has never used smokeless tobacco. He reports current alcohol use. He reports that he does not use drugs.   Family History:  The patient's family history includes Coronary artery disease in an other family member; Dementia in his mother; Skin cancer in an other family member; Stroke in an other family member; Thyroid disease in an other family member.    ROS:  Patient is a poor historian and unable to provide   Exam:      Vital Signs:  BP (!) 97/57   Pulse 84   Ht 5' 7.5" (1.715 m)   Wt 118 lb (53.5 kg)   BMI 18.21 kg/m      Labs/Other Tests and Data Reviewed:    Recent Labs: No results found for requested labs within last 8760 hours.   Wt Readings from Last 3 Encounters:  11/18/18 118 lb (53.5 kg)  03/27/17 130 lb (59 kg)  07/16/16 130 lb (59 kg)     Other studies personally reviewed: Additional studies/ records that were reviewed today include: my prior notes,  Device interrogation  Review of the above records today demonstrates: as above  ASSESSMENT & PLAN:    1.  Complete heart block He is dependant and at [facility] since April Risks, benefits, and alternatives to pacemaker pulse  generator replacement were discussed in detail today.  The patient understands that risks include but are not limited to bleeding, infection, pneumothorax, perforation, tamponade, vascular damage, renal failure, MI, stroke, death,  damage to his existing leads, and lead dislodgement and wishes to proceed.  We will therefore schedule the procedure at the next available time.  2. Persistent atrial fibrillation No longer on anticoagulation due to fall risks  3. CAD/PVD Stable No change required today  4. HTN Stable No change required today  5. Moderate AS Conservative manageent  COVID screen The patient does not have any symptoms of COVID.  We will arrange COVID 19 testing prior to his generator change.   Current medicines are reviewed at length with the patient today.   The patient does not have concerns regarding his medicines.  The following changes were made today:  none  Labs/ tests ordered today include:  No orders of the defined types were placed in this encounter.   Patient Risk:  after full review of this patients clinical status, I feel that they are at moderate risk at this time.   Today, I have spent 20 minutes with the patient with telehealth technology discussing pacemaker at [facility] .    SignedThompson Grayer MD, [place] 11/18/2018 11:01 AM   [facility] [address] [place] [address] [phone] (office) [phone] (fax)

## 2018-11-20 LAB — NOVEL CORONAVIRUS, NAA (HOSP ORDER, SEND-OUT TO REF LAB; TAT 18-24 HRS): SARS-CoV-2, NAA: NOT DETECTED

## 2018-11-21 ENCOUNTER — Encounter (HOSPITAL_COMMUNITY): Payer: Self-pay | Admitting: Internal Medicine

## 2018-11-21 ENCOUNTER — Other Ambulatory Visit: Payer: Self-pay

## 2018-11-21 ENCOUNTER — Ambulatory Visit (HOSPITAL_COMMUNITY)
Admission: RE | Disposition: A | Discharge status: Home / Self Care | Payer: No Typology Code available for payment source | Source: Home / Self Care | Attending: Internal Medicine

## 2018-11-21 ENCOUNTER — Ambulatory Visit (HOSPITAL_COMMUNITY)
Admission: RE | Admit: 2018-11-21 | Discharge: 2018-11-21 | Disposition: A | Discharge status: Home / Self Care | Payer: No Typology Code available for payment source | Attending: Internal Medicine | Admitting: Internal Medicine

## 2018-11-21 DIAGNOSIS — Z79899 Other long term (current) drug therapy: Secondary | ICD-10-CM | POA: Insufficient documentation

## 2018-11-21 DIAGNOSIS — F0390 Unspecified dementia without behavioral disturbance: Secondary | ICD-10-CM | POA: Insufficient documentation

## 2018-11-21 DIAGNOSIS — Z8249 Family history of ischemic heart disease and other diseases of the circulatory system: Secondary | ICD-10-CM | POA: Insufficient documentation

## 2018-11-21 DIAGNOSIS — Z880 Allergy status to penicillin: Secondary | ICD-10-CM | POA: Diagnosis not present

## 2018-11-21 DIAGNOSIS — E785 Hyperlipidemia, unspecified: Secondary | ICD-10-CM | POA: Diagnosis not present

## 2018-11-21 DIAGNOSIS — I442 Atrioventricular block, complete: Secondary | ICD-10-CM | POA: Diagnosis not present

## 2018-11-21 DIAGNOSIS — F17200 Nicotine dependence, unspecified, uncomplicated: Secondary | ICD-10-CM | POA: Insufficient documentation

## 2018-11-21 DIAGNOSIS — I739 Peripheral vascular disease, unspecified: Secondary | ICD-10-CM | POA: Diagnosis not present

## 2018-11-21 DIAGNOSIS — Z881 Allergy status to other antibiotic agents status: Secondary | ICD-10-CM | POA: Diagnosis not present

## 2018-11-21 DIAGNOSIS — I4819 Other persistent atrial fibrillation: Secondary | ICD-10-CM | POA: Diagnosis not present

## 2018-11-21 DIAGNOSIS — Z45010 Encounter for checking and testing of cardiac pacemaker pulse generator [battery]: Secondary | ICD-10-CM

## 2018-11-21 DIAGNOSIS — I10 Essential (primary) hypertension: Secondary | ICD-10-CM | POA: Diagnosis not present

## 2018-11-21 DIAGNOSIS — I2510 Atherosclerotic heart disease of native coronary artery without angina pectoris: Secondary | ICD-10-CM | POA: Diagnosis not present

## 2018-11-21 HISTORY — PX: PPM GENERATOR CHANGEOUT: EP1233

## 2018-11-21 LAB — SURGICAL PCR SCREEN
MRSA, PCR: NEGATIVE
Staphylococcus aureus: POSITIVE — AB

## 2018-11-21 SURGERY — PPM GENERATOR CHANGEOUT

## 2018-11-21 MED ORDER — SODIUM CHLORIDE 0.9 % IV SOLN
INTRAVENOUS | Status: DC
  Administered 2018-11-21: 14:00:00 via INTRAVENOUS

## 2018-11-21 MED ORDER — CHLORHEXIDINE GLUCONATE 4 % EX LIQD: 60.0000 mL | Freq: Once | CUTANEOUS | Status: DC

## 2018-11-21 MED ORDER — SODIUM CHLORIDE 0.9% FLUSH: 3.0000 mL | INTRAVENOUS | Status: DC | PRN

## 2018-11-21 MED ORDER — LIDOCAINE HCL (PF) 1 % IJ SOLN
INTRAMUSCULAR | Status: AC
  Filled 2018-11-21: qty 30

## 2018-11-21 MED ORDER — ONDANSETRON HCL 4 MG/2ML IJ SOLN: 4.0000 mg | Freq: Four times a day (QID) | INTRAMUSCULAR | Status: DC | PRN

## 2018-11-21 MED ORDER — SODIUM CHLORIDE 0.9 % IV SOLN: 250.0000 mL | INTRAVENOUS | Status: DC | PRN

## 2018-11-21 MED ORDER — SODIUM CHLORIDE 0.9 % IV SOLN
80.0000 mg | INTRAVENOUS | Status: AC
  Administered 2018-11-21: 16:00:00 80 mg

## 2018-11-21 MED ORDER — LIDOCAINE HCL (PF) 1 % IJ SOLN
INTRAMUSCULAR | Status: DC | PRN
  Administered 2018-11-21: 50 mL via INTRADERMAL

## 2018-11-21 MED ORDER — MUPIROCIN 2 % EX OINT
TOPICAL_OINTMENT | CUTANEOUS | Status: AC
  Administered 2018-11-21: 14:00:00
  Filled 2018-11-21: qty 22

## 2018-11-21 MED ORDER — ACETAMINOPHEN 325 MG PO TABS: 325.0000 mg | ORAL_TABLET | ORAL | Status: DC | PRN

## 2018-11-21 MED ORDER — VANCOMYCIN HCL IN DEXTROSE 1-5 GM/200ML-% IV SOLN
1000.0000 mg | INTRAVENOUS | Status: AC
  Administered 2018-11-21: 15:00:00 1000 mg via INTRAVENOUS

## 2018-11-21 MED ORDER — VANCOMYCIN HCL IN DEXTROSE 1-5 GM/200ML-% IV SOLN
INTRAVENOUS | Status: AC
  Filled 2018-11-21: qty 200

## 2018-11-21 MED ORDER — SODIUM CHLORIDE 0.9 % IV SOLN
INTRAVENOUS | Status: AC
  Filled 2018-11-21: qty 2

## 2018-11-21 SURGICAL SUPPLY — 4 items
CABLE SURGICAL S-101-97-12 (CABLE) ×3 IMPLANT
PACEMAKER ASSURITY DR-RF (Pacemaker) ×2 IMPLANT
PAD PRO RADIOLUCENT 2001M-C (PAD) ×3 IMPLANT
TRAY PACEMAKER INSERTION (PACKS) ×3 IMPLANT

## 2018-11-21 NOTE — Discharge Instructions (Signed)
Post procedure site/wound care instructions Keep incision clean and dry, do not shower until your wound check visit. No driving for 2 days.  You can remove outer dressing tomorrow. Leave steri-strips (little pieces of tape) on until seen in the office for wound check appointment. Call the office [phone]) for redness, drainage, swelling, or fever.   Pacemaker Battery Change, Care After This sheet gives you information about how to care for yourself after your procedure. Your health care provider may also give you more specific instructions. If you have problems or questions, contact your health care provider. What can I expect after the procedure? After your procedure, it is common to have:  Pain or soreness at the site where the pacemaker was inserted.  Swelling at the site where the pacemaker was inserted. Follow these instructions at home: Incision care   Keep the incision clean and dry. ? Do not take baths, swim, or use a hot tub until your health care provider approves. ? You may shower the day after your procedure, or as directed by your health care provider. ? Pat the area dry with a clean towel. Do not rub the area. This may cause bleeding.  Follow instructions from your health care provider about how to take care of your incision. Make sure you: ? Wash your hands with soap and water before you change your bandage (dressing). If soap and water are not available, use hand sanitizer. ? Change your dressing as told by your health care provider. ? Leave stitches (sutures), skin glue, or adhesive strips in place. These skin closures may need to stay in place for 2 weeks or longer. If adhesive strip edges start to loosen and curl up, you may trim the loose edges. Do not remove adhesive strips completely unless your health care provider tells you to do that.  Check your incision area every day for signs of infection. Check for: ? More redness, swelling, or pain. ? More fluid or  blood. ? Warmth. ? Pus or a bad smell. Activity  Do not lift anything that is heavier than 10 lb (4.5 kg) until your health care provider says it is okay to do so.  For the first 2 weeks, or as long as told by your health care provider: ? Avoid lifting your left arm higher than your shoulder. ? Be gentle when you move your arms over your head. It is okay to raise your arm to comb your hair. ? Avoid strenuous exercise.  Ask your health care provider when it is okay to: ? Resume your normal activities. ? Return to work or school. ? Resume sexual activity. Eating and drinking  Eat a heart-healthy diet. This should include plenty of fresh fruits and vegetables, whole grains, low-fat dairy products, and lean protein like chicken and fish.  Limit alcohol intake to no more than 1 drink a day for non-pregnant women and 2 drinks a day for men. One drink equals 12 oz of beer, 5 oz of wine, or 1 oz of hard liquor.  Check ingredients and nutrition facts on packaged foods and beverages. Avoid the following types of food: ? Food that is high in salt (sodium). ? Food that is high in saturated fat, like full-fat dairy or red meat. ? Food that is high in trans fat, like fried food. ? Food and drinks that are high in sugar. Lifestyle  Do not use any products that contain nicotine or tobacco, such as cigarettes and e-cigarettes. If you need help quitting,  ask your health care provider.  Take steps to manage and control your weight.  Get regular exercise. Aim for 150 minutes of moderate-intensity exercise (such as walking or yoga) or 75 minutes of vigorous exercise (such as running or swimming) each week.  Manage other health problems, such as diabetes or high blood pressure. Ask your health care provider how you can manage these conditions. General instructions  Do not drive for 24 hours after your procedure if you were given a medicine to help you relax (sedative).  Take over-the-counter and  prescription medicines only as told by your health care provider.  Avoid putting pressure on the area where the pacemaker was placed.  If you need an MRI after your pacemaker has been placed, be sure to tell the health care provider who orders the MRI that you have a pacemaker.  Avoid close and prolonged exposure to electrical devices that have strong magnetic fields. These include: ? Cell phones. Avoid keeping them in a pocket near the pacemaker, and try using the ear opposite the pacemaker. ? MP3 players. ? Household appliances, like microwaves. ? Metal detectors. ? Electric generators. ? High-tension wires.  Keep all follow-up visits as directed by your health care provider. This is important. Contact a health care provider if:  You have pain at the incision site that is not relieved by over-the-counter or prescription medicines.  You have any of these around your incision site or coming from it: ? More redness, swelling, or pain. ? Fluid or blood. ? Warmth to the touch. ? Pus or a bad smell.  You have a fever.  You feel brief, occasional palpitations, light-headedness, or any symptoms that you think might be related to your heart. Get help right away if:  You experience chest pain that is different from the pain at the pacemaker site.  You develop a red streak that extends above or below the incision site.  You experience shortness of breath.  You have palpitations or an irregular heartbeat.  You have light-headedness that does not go away quickly.  You faint or have dizzy spells.  Your pulse suddenly drops or increases rapidly and does not return to normal.  You begin to gain weight and your legs and ankles swell. Summary  After your procedure, it is common to have pain, soreness, and some swelling where the pacemaker was inserted.  Make sure to keep your incision clean and dry. Follow instructions from your health care provider about how to take care of your  incision.  Check your incision every day for signs of infection, such as more pain or swelling, pus or a bad smell, warmth, or leaking fluid and blood.  Avoid strenuous exercise and lifting your left arm higher than your shoulder for 2 weeks, or as long as told by your health care provider. This information is not intended to replace advice given to you by your health care provider. Make sure you discuss any questions you have with your health care provider. Document Released: 12/04/2012 Document Revised: 01/26/2017 Document Reviewed: 01/06/2016 Elsevier Patient Education  2020 Elsevier Inc. Mupirocin nasal ointment What is this medicine? MUPIROCIN CALCIUM (myoo PEER oh sin KAL see um) is an antibiotic. It is used inside the nose to treat infections that are caused by certain bacteria. This helps prevent the spread of infection to patients and health care workers during outbreaks at institutions. This medicine may be used for other purposes; ask your health care provider or pharmacist if you have questions.  COMMON BRAND NAME(S): Bactroban What should I tell my health care provider before I take this medicine? They need to know if you have any of these conditions:  an unusual or allergic reaction to mupirocin, other medicines, foods, dyes, or preservatives  pregnant or trying to get pregnant  breast-feeding How should I use this medicine? This medicine is only for use inside the nose. Follow the directions on the prescription label. Wash your hands before and after use. Squeeze half the contents of a single-use tube into one nostril, then squeeze the other half into the other nostril. Press the sides of your nose together and gently massage after application to spread the ointment throughout the nostrils. Do not use your medicine more often than directed. Finish the full course of medicine prescribed by your doctor or health care professional even if you think your condition is better. Talk to  your pediatrician regarding the use of this medicine in children. Special care may be needed. Overdosage: If you think you have taken too much of this medicine contact a poison control center or emergency room at once. NOTE: This medicine is only for you. Do not share this medicine with others. What if I miss a dose? If you miss a dose, take it as soon as you can. If it is almost time for your next dose, take only that dose. Do not take double or extra doses. What may interact with this medicine? Interactions are not expected. Do not use any other nose products without telling your doctor or health care professional. This list may not describe all possible interactions. Give your health care provider a list of all the medicines, herbs, non-prescription drugs, or dietary supplements you use. Also tell them if you smoke, drink alcohol, or use illegal drugs. Some items may interact with your medicine. What should I watch for while using this medicine? If your nose is severely irritated, burning or stinging from use of this medicine, stop using it and contact your doctor or health care professional. Do not get this medicine in your eyes. If you do, rinse out with plenty of cool tap water. What side effects may I notice from receiving this medicine? Side effects that you should report to your doctor or health care professional as soon as possible:  severe irritation, burning, stinging, or pain Side effects that usually do not require medical attention (report to your doctor or health care professional if they continue or are bothersome):  altered taste  cough  headache  skin itching  sore throat  stuffy or runny nose This list may not describe all possible side effects. Call your doctor for medical advice about side effects. You may report side effects to FDA at 1-800-FDA-1088. Where should I keep my medicine? Keep out of the reach of children. Store at room temperature between 15 and 30  degrees C (59 and 86 degrees F). Do not refrigerate. One tube of ointment is for single use in both nostrils. Throw away after use. NOTE: This sheet is a summary. It may not cover all possible information. If you have questions about this medicine, talk to your doctor, pharmacist, or health care provider.  2020 Elsevier/Gold Standard (2007-09-02 14:36:10)

## 2018-11-21 NOTE — Interval H&P Note (Signed)
History and Physical Interval Note:  11/21/2018 3:07 PM  KAELOB PERSKY  has presented today for surgery, with the diagnosis of ERI.  The various methods of treatment have been discussed with the patient and family. After consideration of risks, benefits and other options for treatment, the patient has consented to  Procedure(s): PPM GENERATOR CHANGEOUT (N/A) as a surgical intervention.  The patient's history has been reviewed, patient examined, no change in status, stable for surgery.  I have reviewed the patient's chart and labs.  Questions were answered to the patient's satisfaction.     Thompson Grayer

## 2018-11-22 MED FILL — Gentamicin Sulfate Inj 40 MG/ML: INTRAMUSCULAR | Qty: 80 | Status: AC

## 2018-11-26 ENCOUNTER — Other Ambulatory Visit: Payer: Self-pay | Admitting: Internal Medicine

## 2018-12-05 ENCOUNTER — Other Ambulatory Visit: Payer: Self-pay

## 2018-12-05 ENCOUNTER — Ambulatory Visit (INDEPENDENT_AMBULATORY_CARE_PROVIDER_SITE_OTHER): Payer: No Typology Code available for payment source | Admitting: *Deleted

## 2018-12-05 DIAGNOSIS — I442 Atrioventricular block, complete: Secondary | ICD-10-CM

## 2018-12-05 LAB — CUP PACEART INCLINIC DEVICE CHECK
Battery Remaining Longevity: 97 mo
Battery Voltage: 3.07 V
Brady Statistic RA Percent Paced: 54 %
Brady Statistic RV Percent Paced: 99.42 %
Date Time Interrogation Session: 20201008132749
Implantable Lead Implant Date: 20091009
Implantable Lead Implant Date: 20091009
Implantable Lead Location: 753859
Implantable Lead Location: 753860
Implantable Pulse Generator Implant Date: 20200924
Lead Channel Impedance Value: 400 Ohm
Lead Channel Impedance Value: 400 Ohm
Lead Channel Pacing Threshold Amplitude: 0.5 V
Lead Channel Pacing Threshold Amplitude: 0.75 V
Lead Channel Pacing Threshold Pulse Width: 0.5 ms
Lead Channel Pacing Threshold Pulse Width: 0.5 ms
Lead Channel Sensing Intrinsic Amplitude: 1 mV
Lead Channel Setting Pacing Amplitude: 2 V
Lead Channel Setting Pacing Amplitude: 2.5 V
Lead Channel Setting Pacing Pulse Width: 0.5 ms
Lead Channel Setting Sensing Sensitivity: 4 mV
Pulse Gen Model: 2272
Pulse Gen Serial Number: 9166578

## 2018-12-05 LAB — CUP PACEART REMOTE DEVICE CHECK
Date Time Interrogation Session: 20201008151703
Implantable Lead Implant Date: 20091009
Implantable Lead Implant Date: 20091009
Implantable Lead Location: 753859
Implantable Lead Location: 753860
Implantable Pulse Generator Implant Date: 20200924
Lead Channel Setting Pacing Amplitude: 2 V
Lead Channel Setting Pacing Amplitude: 2.5 V
Lead Channel Setting Pacing Pulse Width: 0.5 ms
Lead Channel Setting Sensing Sensitivity: 4 mV
Pulse Gen Model: 2272
Pulse Gen Serial Number: 9166578

## 2018-12-05 NOTE — Patient Instructions (Signed)
Call office to set up remote monitor when you return home. Call office if you have swelling, redness or drainage from wound site or if you develop a fever.

## 2018-12-05 NOTE — Progress Notes (Signed)
Wound check appointment. Steri-strips removed. Wound without redness or edema. Incision edges approximated, wound well healed. Normal device function. Thresholds, sensing, and impedances consistent with implant measurements. Generator change only, device programmed  at chronic lead settings. Histogram distribution appropriate for patient and level of activity. No mode switches or high ventricular rates noted. Patient and caregiver educated about wound care. Enrolled in remote monitoring, next remote 06/04/19. F/u with Dr Curt Bears 02/24/19.

## 2019-02-24 ENCOUNTER — Telehealth (INDEPENDENT_AMBULATORY_CARE_PROVIDER_SITE_OTHER): Payer: No Typology Code available for payment source | Admitting: Internal Medicine

## 2019-02-24 ENCOUNTER — Other Ambulatory Visit: Payer: Self-pay

## 2019-02-24 ENCOUNTER — Encounter: Payer: No Typology Code available for payment source | Admitting: Internal Medicine

## 2019-02-24 VITALS — BP 125/68 | Ht 64.0 in | Wt 123.0 lb

## 2019-02-24 DIAGNOSIS — I442 Atrioventricular block, complete: Secondary | ICD-10-CM

## 2019-02-24 NOTE — Progress Notes (Signed)
Patient has advanced dementia and requires his daughter's assistance for visits. His daughter is not presently with him.  We will reschedule

## 2019-03-03 ENCOUNTER — Telehealth: Payer: Self-pay

## 2019-03-05 ENCOUNTER — Telehealth (INDEPENDENT_AMBULATORY_CARE_PROVIDER_SITE_OTHER): Payer: Medicare Other | Admitting: Internal Medicine

## 2019-03-05 ENCOUNTER — Encounter: Payer: Self-pay | Admitting: Internal Medicine

## 2019-03-05 VITALS — BP 124/64 | Ht 64.0 in | Wt 122.0 lb

## 2019-03-05 DIAGNOSIS — I4819 Other persistent atrial fibrillation: Secondary | ICD-10-CM

## 2019-03-05 DIAGNOSIS — I442 Atrioventricular block, complete: Secondary | ICD-10-CM | POA: Diagnosis not present

## 2019-03-05 DIAGNOSIS — I10 Essential (primary) hypertension: Secondary | ICD-10-CM

## 2019-03-05 NOTE — Progress Notes (Signed)
Electrophysiology TeleHealth Note   Due to national recommendations of social distancing due to COVID 19, an audio/video telehealth visit is felt to be most appropriate for this patient at this time.  See MyChart message from today for the patient's consent to telehealth for [facility].   Date:  03/05/2019   ID:  John Farmer, DOB 04/21/1930, MRN [id number]  Location: patient's home  Provider location:  [facility]  Evaluation Performed: Follow-up visit  PCP:  Benjiman Core, MD   Electrophysiologist:  Dr Johney Frame  Chief Complaint:  dementia  History of Present Illness:    John Farmer is a 86 y.o. male who presents via telehealth conferencing today.  Since last being seen in our clinic, the patient reports doing reasonably well.  He has advanced dementia.  History is provided by his wife.  He has not had any issues since his pacemaker generator change.  Past Medical History:  Diagnosis Date  . CAD (coronary artery disease)   . Complete heart block (HCC) 12/05/07   s/p PPM  . ED (erectile dysfunction)   . HLD (hyperlipidemia)   . HTN (hypertension)   . Moderate asthma   . 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 Tobacco abuse   . Tobacco abuse     Past Surgical History:  Procedure Laterality Date  . lumbar hnp    . PACEMAKER INSERTION  12/05/07   by Fawn Kirk  . PPM GENERATOR CHANGEOUT N/A 11/21/2018   Procedure: PPM GENERATOR CHANGEOUT;  Surgeon: Hillis Range, MD;  Location: [facility] IN[facility];  Service: Cardiovascular;  Laterality: N/A;  . TONSILLECTOMY      Current Outpatient Medications  Medication Sig Dispense Refill  . amLODipine (NORVASC) 2.5 MG tablet Take 2.5 mg by mouth daily.    Marland Kitchen donepezil (ARICEPT) 10 MG tablet Take 10 mg by mouth daily.     . feeding supplement, ENSURE ENLIVE, (ENSURE ENLIVE) LIQD Take 237 mLs by mouth 2 (two) times daily between meals. 237 mL 12    . finasteride (PROSCAR) 5 MG tablet Take 5 mg by mouth daily.    . memantine (NAMENDA) 10 MG tablet Take 5 mg by mouth 2 (two) times daily.    . pravastatin (PRAVACHOL) 20 MG tablet Take 20 mg by mouth daily.    . tamsulosin (FLOMAX) 0.4 MG CAPS capsule Take 0.4 mg by mouth every evening.      No current facility-administered medications for this visit.    Allergies:   Erythromycin base and Penicillins   Social History:  The patient  reports that he has been smoking. He has never used smokeless tobacco. He reports current alcohol use. He reports that he does not use drugs.   Family History:  The patient's family history includes Coronary artery disease in an other family member; Dementia in his mother; Skin cancer in an other family member; Stroke in an other family member; Thyroid disease in an other family member.   ROS:  Please see the history of present illness.   All other systems are personally reviewed and negative.    Exam:    Vital Signs:  BP 124/64   Ht 5\' 4"  (1.626 m)   Wt 122 lb (55.3 kg)   BMI 20.94 kg/m   Elderly,  Laying in bed with covers and multiple pillows.  History per his daughter who is at his bedside.  Labs/Other Tests and Data  Reviewed:    Recent Labs: 11/18/2018: BUN 21; Creatinine, Ser 1.06; Hemoglobin 14.3; Platelets 193; Potassium 3.9; Sodium 143   Wt Readings from Last 3 Encounters:  03/05/19 122 lb (55.3 kg)  02/24/19 123 lb (55.8 kg)  11/21/18 118 lb (53.5 kg)     Last device remote is reviewed from [employer] PDF which reveals normal device function, no arrhythmias    ASSESSMENT & PLAN:    1.  Complete heart block Remotes are uptodate Normal device function He will resume care at the [place]  His daughter is considering having [place] follow his remotes  2. HTN Stable No change required today  3. Persistent afib Stable No change required today Not a candidate for anticoagulation due to falls  4. CAD/PVD No longer smokes [facility]  Follow-up:  Follow-up has already been arranged with the [facility] I am happy to see as needed We will follow remotes until they assume this care   Patient Risk:  after full review of this patients clinical status, I feel that they are at moderate risk at this time.  Today, I have spent 15 minutes with the patient with telehealth technology discussing arrhythmia management .    Army Fossa, MD  03/05/2019 3:33 PM     [place] [address] [phone] (office) [phone] (fax)

## 2019-05-14 ENCOUNTER — Telehealth: Payer: Self-pay | Admitting: Internal Medicine

## 2019-05-14 NOTE — Telephone Encounter (Signed)
Spoke with [name]. Assisted with initiating manual transmission. Pt had disassembled monitor and cell adapter so update was required. Advised I will call back after transmission received. [name] in agreement with plan.

## 2019-05-14 NOTE — Telephone Encounter (Signed)
Attempted to return call to [place]. Received automated message that "call cannot be completed at this time due to network difficulties." Will try again later.  Spoke with John Farmer (DPR). She is agreeable to transferring pt's monitoring to the [place]. [name] requests any recent data about AF episodes, specifically on this past Sunday. Advised we will need an updated transmission for review. [name] verbalizes understanding and will plan to call back this afternoon when she is home with the pt for assistance. Direct DC number provided.

## 2019-05-14 NOTE — Telephone Encounter (Signed)
LMOVM for [place] at the [place]. Direct DC number provided. Will plan to release patient in [place] per family request.

## 2019-05-14 NOTE — Telephone Encounter (Signed)
Manual transmission received, spoke with pt daughter Eunice Blase and advised.  Report reviewed advised no episodes of arrythmia noted earlier in the week when pt daughter indicates pt was "out of sorts"  Pt HR was elevated but WNL.

## 2019-05-14 NOTE — Telephone Encounter (Signed)
Lyla Son, RN with the [employer], states she would like to inquire about whether or not the patient is listed on [employer] monitor site. She states if he is listed, she is requesting to have him released from monitor site. She states she would also like to be informed about whether or not the patient has been in afib recently.  Please return call to discuss with Lyla Son at [phone] (ext: [phone]).

## 2019-05-15 ENCOUNTER — Ambulatory Visit (INDEPENDENT_AMBULATORY_CARE_PROVIDER_SITE_OTHER): Payer: No Typology Code available for payment source | Admitting: *Deleted

## 2019-05-15 ENCOUNTER — Telehealth: Payer: Self-pay | Admitting: Emergency Medicine

## 2019-05-15 DIAGNOSIS — I442 Atrioventricular block, complete: Secondary | ICD-10-CM | POA: Diagnosis not present

## 2019-05-15 LAB — CUP PACEART REMOTE DEVICE CHECK
Battery Remaining Longevity: 94 mo
Battery Remaining Percentage: 95.5 %
Battery Voltage: 3.01 V
Brady Statistic AP VP Percent: 64 %
Brady Statistic AP VS Percent: 1 %
Brady Statistic AS VP Percent: 35 %
Brady Statistic AS VS Percent: 1 %
Brady Statistic RA Percent Paced: 64 %
Brady Statistic RV Percent Paced: 99 %
Date Time Interrogation Session: 20210317154906
Implantable Lead Implant Date: 20091009
Implantable Lead Implant Date: 20091009
Implantable Lead Location: 753859
Implantable Lead Location: 753860
Implantable Pulse Generator Implant Date: 20200924
Lead Channel Impedance Value: 380 Ohm
Lead Channel Impedance Value: 400 Ohm
Lead Channel Pacing Threshold Amplitude: 0.5 V
Lead Channel Pacing Threshold Amplitude: 0.75 V
Lead Channel Pacing Threshold Pulse Width: 0.5 ms
Lead Channel Pacing Threshold Pulse Width: 0.5 ms
Lead Channel Sensing Intrinsic Amplitude: 1 mV
Lead Channel Sensing Intrinsic Amplitude: 12 mV
Lead Channel Setting Pacing Amplitude: 2 V
Lead Channel Setting Pacing Amplitude: 2.5 V
Lead Channel Setting Pacing Pulse Width: 0.5 ms
Lead Channel Setting Sensing Sensitivity: 4 mV
Pulse Gen Model: 2272
Pulse Gen Serial Number: 9166578

## 2019-05-15 NOTE — Telephone Encounter (Signed)
Patient is now being seen at [facility] for his pacemaker care . Patient had possible TIA on 05/11/19 and results of 05/14/19 transmission reviewed with Emmie Niemann from [place]. No AF episodes noted. Will request transfer in [facility] .

## 2019-05-16 NOTE — Progress Notes (Signed)
PPM Remote  

## 2019-05-19 NOTE — Telephone Encounter (Signed)
Ntoed  

## 2019-05-19 NOTE — Telephone Encounter (Signed)
LMOVM for John Farmer advising that patient has been released in [employer]. Provided direct DC phone number for any questions or concerns.

## 2019-05-30 NOTE — Telephone Encounter (Signed)
LMOVM for John Farmer to call my direct office number.

## 2019-06-05 NOTE — Telephone Encounter (Signed)
Spoke with [employer] Confirmed successful transfer in [place] to the [place].

## 2020-01-09 ENCOUNTER — Telehealth: Payer: Self-pay

## 2020-01-09 NOTE — Telephone Encounter (Signed)
Patient is deceased and I have emailed [place] for a return kit to be sent to the patients home.

## 2020-01-28 DEATH — deceased
